# Patient Record
Sex: Female | Born: 1975 | Hispanic: Yes | Smoking: Former smoker
Health system: Southern US, Community
[De-identification: ages and names within clinical notes are randomized; demographics above are authoritative.]

## PROBLEM LIST (undated history)

## (undated) DIAGNOSIS — M79643 Pain in unspecified hand: Secondary | ICD-10-CM

## (undated) DIAGNOSIS — B009 Herpesviral infection, unspecified: Secondary | ICD-10-CM

## (undated) HISTORY — PX: GANGLION CYST EXCISION: SHX1691

## (undated) HISTORY — DX: Herpesviral infection, unspecified: B00.9

## (undated) HISTORY — DX: Pain in unspecified hand: M79.643

---

## 2005-07-30 ENCOUNTER — Emergency Department (HOSPITAL_COMMUNITY): Admission: EM | Admit: 2005-07-30 | Discharge: 2005-07-30 | Payer: Self-pay | Admitting: Emergency Medicine

## 2005-09-19 ENCOUNTER — Ambulatory Visit (HOSPITAL_COMMUNITY): Admission: RE | Admit: 2005-09-19 | Discharge: 2005-09-19 | Payer: Self-pay | Admitting: Obstetrics & Gynecology

## 2006-02-11 ENCOUNTER — Inpatient Hospital Stay (HOSPITAL_COMMUNITY): Admission: AD | Admit: 2006-02-11 | Discharge: 2006-02-13 | Payer: Self-pay | Admitting: Family Medicine

## 2006-02-11 ENCOUNTER — Ambulatory Visit: Payer: Self-pay | Admitting: Gynecology

## 2007-06-08 ENCOUNTER — Emergency Department (HOSPITAL_COMMUNITY): Admission: EM | Admit: 2007-06-08 | Discharge: 2007-06-08 | Payer: Self-pay | Admitting: Emergency Medicine

## 2008-12-19 ENCOUNTER — Encounter: Admission: RE | Admit: 2008-12-19 | Discharge: 2008-12-19 | Payer: Self-pay | Admitting: General Surgery

## 2011-04-24 ENCOUNTER — Encounter: Payer: Self-pay | Admitting: Family Medicine

## 2011-04-24 ENCOUNTER — Other Ambulatory Visit (HOSPITAL_COMMUNITY)
Admission: RE | Admit: 2011-04-24 | Discharge: 2011-04-24 | Disposition: A | Discharge status: Home / Self Care | Payer: BC Managed Care – PPO | Source: Ambulatory Visit | Attending: Family Medicine | Admitting: Family Medicine

## 2011-04-24 ENCOUNTER — Ambulatory Visit (INDEPENDENT_AMBULATORY_CARE_PROVIDER_SITE_OTHER): Payer: BC Managed Care – PPO | Admitting: Family Medicine

## 2011-04-24 VITALS — BP 120/80 | HR 52 | Temp 98.5°F | Wt 117.5 lb

## 2011-04-24 DIAGNOSIS — Z1159 Encounter for screening for other viral diseases: Secondary | ICD-10-CM | POA: Insufficient documentation

## 2011-04-24 DIAGNOSIS — Z01419 Encounter for gynecological examination (general) (routine) without abnormal findings: Secondary | ICD-10-CM | POA: Insufficient documentation

## 2011-04-24 DIAGNOSIS — Z136 Encounter for screening for cardiovascular disorders: Secondary | ICD-10-CM

## 2011-04-24 DIAGNOSIS — Z0000 Encounter for general adult medical examination without abnormal findings: Secondary | ICD-10-CM | POA: Insufficient documentation

## 2011-04-24 LAB — BASIC METABOLIC PANEL
Calcium: 9 mg/dL (ref 8.4–10.5)
GFR: 144.69 mL/min (ref >60.00)
Potassium: 3.3 mEq/L — ABNORMAL LOW (ref 3.5–5.1)
Sodium: 136 mEq/L (ref 135–145)

## 2011-04-24 LAB — LIPID PANEL
Cholesterol: 223 mg/dL — ABNORMAL HIGH (ref 0–200)
HDL: 67.6 mg/dL (ref >39.00)
Triglycerides: 233 mg/dL — ABNORMAL HIGH (ref 0.0–149.0)
VLDL: 46.6 mg/dL — ABNORMAL HIGH (ref 0.0–40.0)

## 2011-04-24 NOTE — Progress Notes (Signed)
Addended by: Gilmer Mor on: 04/24/2011 02:30 PM   Modules accepted: Orders

## 2011-04-24 NOTE — Progress Notes (Signed)
Subjective:    Patient ID: Sabrina Randall, female    DOB: January 30, 1976, 36 y.o.   MRN: [id number]  HPI G5P2 here to establish care and for CPX with no complaints. No h/o abnormal pap smears. Denies any dysuria or vaginal discharge. Sexually active with husband only.  Had a mammogram two years ago for cystic lesion, was benign.  No family h/o breast, cervical or uterine cancer that she is aware of.  Patient Active Problem List  Diagnoses  . Routine general medical examination at a health care facility   No past medical history on file. No past surgical history on file. History  Substance Use Topics  . Smoking status: Never Smoker   . Smokeless tobacco: Not on file  . Alcohol Use: Not on file   No family history on file. No Known Allergies No current outpatient prescriptions on file prior to visit.   The PMH, PSH, Social History, Family History, Medications, and allergies have been reviewed in [facility], and have been updated if relevant.    Review of Systems See HPI Patient reports no  vision/ hearing changes,anorexia, weight change, fever ,adenopathy, persistant / recurrent hoarseness, swallowing issues, chest pain, edema,persistant / recurrent cough, hemoptysis, dyspnea(rest, exertional, paroxysmal nocturnal), gastrointestinal  bleeding (melena, rectal bleeding), abdominal pain, excessive heart burn, GU symptoms(dysuria, hematuria, pyuria, voiding/incontinence  Issues) syncope, focal weakness, severe memory loss, concerning skin lesions, depression, anxiety, abnormal bruising/bleeding, major joint swelling, breast masses or abnormal vaginal bleeding.       Objective:   Physical Exam BP 120/80  Pulse 52  Temp(Src) 98.5 F (36.9 C) (Oral)  Wt 117 lb 8 oz (53.298 kg)  LMP 04/02/2011  General:  Well-developed,well-nourished,in no acute distress; alert,appropriate and cooperative throughout examination Head:  normocephalic and atraumatic.   Eyes:  vision grossly intact,  pupils equal, pupils round, and pupils reactive to light.   Ears:  R ear normal and L ear normal.   Nose:  no external deformity.   Mouth:  good dentition.   Neck:  No deformities, masses, or tenderness noted. Breasts:  No mass, nodules, thickening, tenderness, bulging, retraction, inflamation, nipple discharge or skin changes noted.   Lungs:  Normal respiratory effort, chest expands symmetrically. Lungs are clear to auscultation, no crackles or wheezes. Heart:  Normal rate and regular rhythm. S1 and S2 normal without gallop, murmur, click, rub or other extra sounds. Abdomen:  Bowel sounds positive,abdomen soft and non-tender without masses, organomegaly or hernias noted. Rectal:  no external abnormalities.   Genitalia:  Pelvic Exam:        External: normal female genitalia without lesions or masses        Vagina: normal without lesions or masses        Cervix: normal without lesions or masses        Adnexa: normal bimanual exam without masses or fullness        Uterus: normal by palpation        Pap smear: performed Msk:  No deformity or scoliosis noted of thoracic or lumbar spine.   Extremities:  No clubbing, cyanosis, edema, or deformity noted with normal full range of motion of all joints.   Neurologic:  alert & oriented X3 and gait normal.   Skin:  Intact without suspicious lesions or rashes Cervical Nodes:  No lymphadenopathy noted Axillary Nodes:  No palpable lymphadenopathy Psych:  Cognition and judgment appear intact. Alert and cooperative with normal attention span and concentration. No apparent delusions, illusions, hallucinations  Assessment & Plan:   1. Routine general medical examination at a health care facility  Basic Metabolic Panel (BMET) Lipid Profile  Reviewed preventive care protocols, scheduled due services, and updated immunizations Discussed nutrition, exercise, diet, and healthy lifestyle.

## 2011-04-24 NOTE — Patient Instructions (Signed)
It was very nice to meet you. We will be in touch with your lab and pap smear results.

## 2011-04-25 ENCOUNTER — Encounter: Payer: Self-pay | Admitting: *Deleted

## 2011-05-02 ENCOUNTER — Encounter: Payer: Self-pay | Admitting: *Deleted

## 2012-01-09 ENCOUNTER — Emergency Department (HOSPITAL_COMMUNITY)
Admission: EM | Admit: 2012-01-09 | Discharge: 2012-01-10 | Disposition: A | Discharge status: Home / Self Care | Payer: No Typology Code available for payment source | Attending: Emergency Medicine | Admitting: Emergency Medicine

## 2012-01-09 DIAGNOSIS — S301XXA Contusion of abdominal wall, initial encounter: Secondary | ICD-10-CM

## 2012-01-09 DIAGNOSIS — M549 Dorsalgia, unspecified: Secondary | ICD-10-CM | POA: Insufficient documentation

## 2012-01-09 DIAGNOSIS — M542 Cervicalgia: Secondary | ICD-10-CM | POA: Insufficient documentation

## 2012-01-09 DIAGNOSIS — S0003XA Contusion of scalp, initial encounter: Secondary | ICD-10-CM | POA: Insufficient documentation

## 2012-01-09 DIAGNOSIS — R51 Headache: Secondary | ICD-10-CM | POA: Insufficient documentation

## 2012-01-09 DIAGNOSIS — S1093XA Contusion of unspecified part of neck, initial encounter: Secondary | ICD-10-CM | POA: Insufficient documentation

## 2012-01-09 MED ORDER — OXYCODONE-ACETAMINOPHEN 5-325 MG PO TABS
2.0000 | ORAL_TABLET | Freq: Once | ORAL | Status: AC
  Administered 2012-01-10: 2 via ORAL
  Filled 2012-01-09: qty 2
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
## 2012-01-09 NOTE — ED Notes (Signed)
Pt. To CT
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
## 2012-01-10 ENCOUNTER — Emergency Department (HOSPITAL_COMMUNITY): Payer: No Typology Code available for payment source

## 2012-01-10 ENCOUNTER — Encounter (HOSPITAL_COMMUNITY): Payer: Self-pay | Admitting: Emergency Medicine

## 2012-01-10 MED ORDER — DIAZEPAM 5 MG PO TABS: 5.0000 mg | ORAL_TABLET | Freq: Three times a day (TID) | ORAL | Status: DC | PRN

## 2012-01-10 MED ORDER — OXYCODONE-ACETAMINOPHEN 5-325 MG PO TABS: 2.0000 | ORAL_TABLET | ORAL | Status: DC | PRN

## 2012-01-10 MED ORDER — OXYCODONE-ACETAMINOPHEN 5-325 MG PO TABS: 2.0000 | ORAL_TABLET | Freq: Once | ORAL | Status: DC

## 2012-01-10 MED ORDER — IBUPROFEN 800 MG PO TABS: 800.0000 mg | ORAL_TABLET | Freq: Three times a day (TID) | ORAL | Status: DC

## 2012-01-10 NOTE — ED Notes (Signed)
Pt currently in C collar. Pt appears to be in no apparent distress. Pt c/o throbbing pain in head, neck and back. Medication given. Will continue to monitor pt.

## 2012-01-10 NOTE — ED Notes (Signed)
Patient ambulated to restroom with assistance without difficulty; patient states that she feels she is ready to go home.  Dr. Norlene Campbell notified.

## 2012-01-10 NOTE — ED Notes (Signed)
Dr. Norlene Campbell removed pt from backboard using C spine precautions. Collar still placed on pt.

## 2012-01-10 NOTE — ED Notes (Signed)
Pt ambulatory leaving. Pt left with discharge instructions and prescriptions for medications. Pt verbalized understanding of instructions. Pt had no questions. Pt denies pain. Pt instructed not to drive due to medications given. Pt states neighbor in waiting room will drive her home. Pt left with family and friends.

## 2012-01-15 ENCOUNTER — Encounter (HOSPITAL_COMMUNITY): Payer: Self-pay | Admitting: Emergency Medicine

## 2013-03-24 ENCOUNTER — Ambulatory Visit (INDEPENDENT_AMBULATORY_CARE_PROVIDER_SITE_OTHER): Payer: BC Managed Care – PPO | Admitting: Psychology

## 2013-03-24 DIAGNOSIS — F4321 Adjustment disorder with depressed mood: Secondary | ICD-10-CM

## 2013-03-31 ENCOUNTER — Ambulatory Visit (INDEPENDENT_AMBULATORY_CARE_PROVIDER_SITE_OTHER): Payer: BC Managed Care – PPO | Admitting: Psychology

## 2013-03-31 DIAGNOSIS — F4323 Adjustment disorder with mixed anxiety and depressed mood: Secondary | ICD-10-CM

## 2013-04-07 ENCOUNTER — Ambulatory Visit (INDEPENDENT_AMBULATORY_CARE_PROVIDER_SITE_OTHER): Payer: BC Managed Care – PPO | Admitting: Psychology

## 2013-04-07 DIAGNOSIS — F4323 Adjustment disorder with mixed anxiety and depressed mood: Secondary | ICD-10-CM

## 2013-04-28 ENCOUNTER — Ambulatory Visit (INDEPENDENT_AMBULATORY_CARE_PROVIDER_SITE_OTHER): Payer: BC Managed Care – PPO | Admitting: Psychology

## 2013-04-28 DIAGNOSIS — F4321 Adjustment disorder with depressed mood: Secondary | ICD-10-CM

## 2013-05-04 ENCOUNTER — Other Ambulatory Visit: Payer: Self-pay | Admitting: Family Medicine

## 2013-05-04 DIAGNOSIS — Z0000 Encounter for general adult medical examination without abnormal findings: Secondary | ICD-10-CM

## 2013-05-04 DIAGNOSIS — Z136 Encounter for screening for cardiovascular disorders: Secondary | ICD-10-CM

## 2013-05-05 ENCOUNTER — Other Ambulatory Visit (INDEPENDENT_AMBULATORY_CARE_PROVIDER_SITE_OTHER): Payer: BC Managed Care – PPO

## 2013-05-05 ENCOUNTER — Ambulatory Visit (INDEPENDENT_AMBULATORY_CARE_PROVIDER_SITE_OTHER): Payer: BC Managed Care – PPO | Admitting: Psychology

## 2013-05-05 DIAGNOSIS — F4321 Adjustment disorder with depressed mood: Secondary | ICD-10-CM

## 2013-05-05 DIAGNOSIS — Z136 Encounter for screening for cardiovascular disorders: Secondary | ICD-10-CM

## 2013-05-05 DIAGNOSIS — Z0000 Encounter for general adult medical examination without abnormal findings: Secondary | ICD-10-CM

## 2013-05-05 LAB — CBC WITH DIFFERENTIAL/PLATELET
BASOS ABS: 0 10*3/uL (ref 0.0–0.1)
Basophils Relative: 0.5 % (ref 0.0–3.0)
EOS ABS: 0 10*3/uL (ref 0.0–0.7)
EOS PCT: 0.5 % (ref 0.0–5.0)
HCT: 33 % — ABNORMAL LOW (ref 36.0–46.0)
Hemoglobin: 10.6 g/dL — ABNORMAL LOW (ref 12.0–15.0)
LYMPHS PCT: 29.4 % (ref 12.0–46.0)
Lymphs Abs: 1.5 10*3/uL (ref 0.7–4.0)
MCHC: 32.1 g/dL (ref 30.0–36.0)
MCV: 71.6 fl — ABNORMAL LOW (ref 78.0–100.0)
Monocytes Absolute: 0.4 10*3/uL (ref 0.1–1.0)
Monocytes Relative: 7.9 % (ref 3.0–12.0)
NEUTROS PCT: 61.7 % (ref 43.0–77.0)
Neutro Abs: 3.2 10*3/uL (ref 1.4–7.7)
PLATELETS: 377 10*3/uL (ref 150.0–400.0)
RBC: 4.61 Mil/uL (ref 3.87–5.11)
RDW: 18.1 % — ABNORMAL HIGH (ref 11.5–14.6)
WBC: 5.2 10*3/uL (ref 4.5–10.5)

## 2013-05-05 LAB — COMPREHENSIVE METABOLIC PANEL
ALK PHOS: 69 U/L (ref 39–117)
ALT: 11 U/L (ref 0–35)
AST: 18 U/L (ref 0–37)
Albumin: 4 g/dL (ref 3.5–5.2)
BUN: 8 mg/dL (ref 6–23)
CHLORIDE: 106 meq/L (ref 96–112)
CO2: 25 meq/L (ref 19–32)
Calcium: 9 mg/dL (ref 8.4–10.5)
Creatinine, Ser: 0.6 mg/dL (ref 0.4–1.2)
GFR: 128.47 mL/min (ref >60.00)
Glucose, Bld: 76 mg/dL (ref 70–99)
Potassium: 3.6 mEq/L (ref 3.5–5.1)
SODIUM: 137 meq/L (ref 135–145)
TOTAL PROTEIN: 7.7 g/dL (ref 6.0–8.3)
Total Bilirubin: 0.9 mg/dL (ref 0.3–1.2)

## 2013-05-05 LAB — LIPID PANEL
CHOL/HDL RATIO: 4
Cholesterol: 200 mg/dL (ref 0–200)
HDL: 52 mg/dL (ref >39.00)
LDL CALC: 125 mg/dL — AB (ref 0–99)
Triglycerides: 115 mg/dL (ref 0.0–149.0)
VLDL: 23 mg/dL (ref 0.0–40.0)

## 2013-05-05 LAB — TSH: TSH: 0.74 u[IU]/mL (ref 0.35–5.50)

## 2013-05-09 ENCOUNTER — Encounter: Payer: Self-pay | Admitting: Family Medicine

## 2013-05-09 ENCOUNTER — Ambulatory Visit (INDEPENDENT_AMBULATORY_CARE_PROVIDER_SITE_OTHER): Payer: BC Managed Care – PPO | Admitting: Family Medicine

## 2013-05-09 VITALS — BP 124/70 | HR 85 | Temp 98.3°F | Ht 59.0 in | Wt 116.2 lb

## 2013-05-09 DIAGNOSIS — D509 Iron deficiency anemia, unspecified: Secondary | ICD-10-CM | POA: Insufficient documentation

## 2013-05-09 DIAGNOSIS — Z01419 Encounter for gynecological examination (general) (routine) without abnormal findings: Secondary | ICD-10-CM

## 2013-05-09 DIAGNOSIS — Z0000 Encounter for general adult medical examination without abnormal findings: Secondary | ICD-10-CM

## 2013-05-09 NOTE — Assessment & Plan Note (Signed)
Reviewed preventive care protocols, scheduled due services, and updated immunizations Discussed nutrition, exercise, diet, and healthy lifestyle.  

## 2013-05-09 NOTE — Assessment & Plan Note (Signed)
Periods have been heavier. Given list of iron rich foods and advised PNV.

## 2013-05-09 NOTE — Patient Instructions (Signed)
° °  Iron-Rich Diet ° °An iron-rich diet contains foods that are good sources of iron. Iron is an important mineral that helps your body produce hemoglobin. Hemoglobin is a protein in red blood cells that carries oxygen to the body's tissues. Sometimes, the iron level in your blood can be low. This may be caused by: °· A lack of iron in your diet. °· Blood loss. °· Times of growth, such as during pregnancy or during a child's growth and development. °Low levels of iron can cause a decrease in the number of red blood cells. This can result in iron deficiency anemia. Iron deficiency anemia symptoms include: °· Tiredness. °· Weakness. °· Irritability. °· Increased chance of infection. °Here are some recommendations for daily iron intake: °· Males older than 39 years of age need 8 mg of iron per day. °· Women ages 19 to 50 need 18 mg of iron per day. °· Pregnant women need 27 mg of iron per day, and women who are over 19 years of age and breastfeeding need 9 mg of iron per day. °· Women over the age of 50 need 8 mg of iron per day. °SOURCES OF IRON °There are 2 types of iron that are found in food: heme iron and nonheme iron. Heme iron is absorbed by the body better than nonheme iron. Heme iron is found in meat, poultry, and fish. Nonheme iron is found in grains, beans, and vegetables. °Heme Iron Sources °Food / Iron (mg) °· Chicken liver, 3 oz (85 g)/ 10 mg °· Beef liver, 3 oz (85 g)/ 5.5 mg °· Oysters, 3 oz (85 g)/ 8 mg °· Beef, 3 oz (85 g)/ 2 to 3 mg °· Shrimp, 3 oz (85 g)/ 2.8 mg °· Turkey, 3 oz (85 g)/ 2 mg °· Chicken, 3 oz (85 g) / 1 mg °· Fish (tuna, halibut), 3 oz (85 g)/ 1 mg °· Pork, 3 oz (85 g)/ 0.9 mg °Nonheme Iron Sources °Food / Iron (mg) °· Ready-to-eat breakfast cereal, iron-fortified / 3.9 to 7 mg °· Tofu, ½ cup / 3.4 mg °· Kidney beans, ½ cup / 2.6 mg °· Baked potato with skin / 2.7 mg °· Asparagus, ½ cup / 2.2 mg °· Avocado / 2 mg °· Dried peaches, ½ cup / 1.6 mg °· Raisins, ½ cup / 1.5 mg °· Soy milk,  1 cup / 1.5 mg °· Whole-wheat bread, 1 slice / 1.2 mg °· Spinach, 1 cup / 0.8 mg °· Broccoli, ½ cup / 0.6 mg °IRON ABSORPTION °Certain foods can decrease the body's absorption of iron. Try to avoid these foods and beverages while eating meals with iron-containing foods: °· Coffee. °· Tea. °· Fiber. °· Soy. °Foods containing vitamin C can help increase the amount of iron your body absorbs from iron sources, especially from nonheme sources. Eat foods with vitamin C along with iron-containing foods to increase your iron absorption. Foods that are high in vitamin C include many fruits and vegetables. Some good sources are: °· Fresh orange juice. °· Oranges. °· Strawberries. °· Mangoes. °· Grapefruit. °· Red bell peppers. °· Green bell peppers. °· Broccoli. °· Potatoes with skin. °· Tomato juice. °Document Released: 11/19/2004 Document Revised: 06/30/2011 Document Reviewed: 09/26/2010 °[employer]® Patient Information ©2014 [employer]. ° °

## 2013-05-09 NOTE — Progress Notes (Signed)
Pre-visit discussion using our clinic review tool. No additional management support is needed unless otherwise documented below in the visit note.  

## 2013-05-09 NOTE — Progress Notes (Signed)
Subjective:    Patient ID: Sabrina Randall, female    DOB: 09-24-1975, 38 y.o.   MRN: [id number]  HPI 38 yo G5P2 here for CPX with no complaints. No h/o abnormal pap smears.  Last pap smear done by me in 04/2011. Denies any dysuria or vaginal discharge. Sexually active with husband only.   No family h/o breast, cervical or uterine cancer that she is aware of.  Lab Results  Component Value Date   CHOL 200 05/05/2013   HDL 52.00 05/05/2013   LDLCALC 125* 05/05/2013   LDLDIRECT 133.5 04/24/2011   TRIG 115.0 05/05/2013   CHOLHDL 4 05/05/2013   Lab Results  Component Value Date   WBC 5.2 05/05/2013   HGB 10.6* 05/05/2013   HCT 33.0* 05/05/2013   MCV 71.6* 05/05/2013   PLT 377.0 05/05/2013   Lab Results  Component Value Date   NA 137 05/05/2013   K 3.6 05/05/2013   CL 106 05/05/2013   CO2 25 05/05/2013   Lab Results  Component Value Date   CREATININE 0.6 05/05/2013     Patient Active Problem List   Diagnosis Date Noted  . Encounter for routine gynecological examination 05/09/2013  . Routine general medical examination at a health care facility 04/24/2011   No past medical history on file. No past surgical history on file. History  Substance Use Topics  . Smoking status: Not on file  . Smokeless tobacco: Not on file  . Alcohol Use: Not on file   No family history on file. No Known Allergies Current Outpatient Prescriptions on File Prior to Visit  Medication Sig Dispense Refill  . ibuprofen (ADVIL,MOTRIN) 800 MG tablet Take 1 tablet (800 mg total) by mouth 3 (three) times daily.  21 tablet  0  . Multiple Vitamin (MULTIVITAMIN WITH MINERALS) TABS Take 1 tablet by mouth daily.       No current facility-administered medications on file prior to visit.   The PMH, PSH, Social History, Family History, Medications, and allergies have been reviewed in [facility], and have been updated if relevant.    Review of Systems See HPI Patient reports no  vision/ hearing changes,anorexia,  weight change, fever ,adenopathy, persistant / recurrent hoarseness, swallowing issues, chest pain, edema,persistant / recurrent cough, hemoptysis, dyspnea(rest, exertional, paroxysmal nocturnal), gastrointestinal  bleeding (melena, rectal bleeding), abdominal pain, excessive heart burn, GU symptoms(dysuria, hematuria, pyuria, voiding/incontinence  Issues) syncope, focal weakness, severe memory loss, concerning skin lesions, depression, anxiety, abnormal bruising/bleeding, major joint swelling, breast masses or abnormal vaginal bleeding.       Objective:   Physical Exam BP 124/70  Pulse 85  Temp(Src) 98.3 F (36.8 C) (Oral)  Ht 4\' 11"  (1.499 m)  Wt 116 lb 4 oz (52.731 kg)  BMI 23.47 kg/m2  SpO2 96%  LMP 05/06/2013  General:  Well-developed,well-nourished,in no acute distress; alert,appropriate and cooperative throughout examination Head:  normocephalic and atraumatic.   Eyes:  vision grossly intact, pupils equal, pupils round, and pupils reactive to light.   Ears:  R ear normal and L ear normal.   Nose:  no external deformity.   Mouth:  good dentition.   Neck:  No deformities, masses, or tenderness noted. Breasts:  No mass, nodules, thickening, tenderness, bulging, retraction, inflamation, nipple discharge or skin changes noted.   Lungs:  Normal respiratory effort, chest expands symmetrically. Lungs are clear to auscultation, no crackles or wheezes. Heart:  Normal rate and regular rhythm. S1 and S2 normal without gallop, murmur, click, rub or other extra sounds.  Abdomen:  Bowel sounds positive,abdomen soft and non-tender without masses, organomegaly or hernias noted. Msk:  No deformity or scoliosis noted of thoracic or lumbar spine.   Extremities:  No clubbing, cyanosis, edema, or deformity noted with normal full range of motion of all joints.   Neurologic:  alert & oriented X3 and gait normal.   Skin:  Intact without suspicious lesions or rashes Cervical Nodes:  No lymphadenopathy  noted Axillary Nodes:  No palpable lymphadenopathy Prominent fat pads under axilla bilaterally Psych:  Cognition and judgment appear intact. Alert and cooperative with normal attention span and concentration. No apparent delusions, illusions, hallucinations       Assessment & Plan:

## 2013-05-19 ENCOUNTER — Ambulatory Visit (INDEPENDENT_AMBULATORY_CARE_PROVIDER_SITE_OTHER): Payer: BC Managed Care – PPO | Admitting: Psychology

## 2013-05-19 DIAGNOSIS — F4321 Adjustment disorder with depressed mood: Secondary | ICD-10-CM

## 2013-05-26 ENCOUNTER — Ambulatory Visit (INDEPENDENT_AMBULATORY_CARE_PROVIDER_SITE_OTHER): Payer: BC Managed Care – PPO | Admitting: Psychology

## 2013-05-26 DIAGNOSIS — F4321 Adjustment disorder with depressed mood: Secondary | ICD-10-CM

## 2013-05-30 ENCOUNTER — Ambulatory Visit: Payer: BC Managed Care – PPO | Admitting: Family Medicine

## 2013-06-02 ENCOUNTER — Ambulatory Visit: Payer: BC Managed Care – PPO | Admitting: Psychology

## 2013-06-06 ENCOUNTER — Ambulatory Visit: Payer: BC Managed Care – PPO | Admitting: Family Medicine

## 2013-06-09 ENCOUNTER — Ambulatory Visit: Payer: BC Managed Care – PPO | Admitting: Psychology

## 2013-06-16 ENCOUNTER — Ambulatory Visit: Payer: BC Managed Care – PPO | Admitting: Psychology

## 2013-06-20 ENCOUNTER — Ambulatory Visit: Payer: BC Managed Care – PPO | Admitting: Family Medicine

## 2013-06-23 ENCOUNTER — Ambulatory Visit: Payer: BC Managed Care – PPO | Admitting: Psychology

## 2013-06-30 ENCOUNTER — Ambulatory Visit (INDEPENDENT_AMBULATORY_CARE_PROVIDER_SITE_OTHER): Payer: BC Managed Care – PPO | Admitting: Psychology

## 2013-06-30 DIAGNOSIS — F4321 Adjustment disorder with depressed mood: Secondary | ICD-10-CM

## 2013-07-07 ENCOUNTER — Ambulatory Visit (INDEPENDENT_AMBULATORY_CARE_PROVIDER_SITE_OTHER): Payer: BC Managed Care – PPO | Admitting: Psychology

## 2013-07-07 DIAGNOSIS — F4321 Adjustment disorder with depressed mood: Secondary | ICD-10-CM

## 2013-07-14 ENCOUNTER — Ambulatory Visit: Payer: BC Managed Care – PPO | Admitting: Psychology

## 2013-07-21 ENCOUNTER — Ambulatory Visit (INDEPENDENT_AMBULATORY_CARE_PROVIDER_SITE_OTHER): Payer: BC Managed Care – PPO | Admitting: Psychology

## 2013-07-21 DIAGNOSIS — F4321 Adjustment disorder with depressed mood: Secondary | ICD-10-CM

## 2013-07-29 ENCOUNTER — Other Ambulatory Visit (HOSPITAL_COMMUNITY)
Admission: RE | Admit: 2013-07-29 | Discharge: 2013-07-29 | Disposition: A | Discharge status: Home / Self Care | Payer: BC Managed Care – PPO | Source: Ambulatory Visit | Attending: Family Medicine | Admitting: Family Medicine

## 2013-07-29 ENCOUNTER — Ambulatory Visit (INDEPENDENT_AMBULATORY_CARE_PROVIDER_SITE_OTHER): Payer: BC Managed Care – PPO | Admitting: Family Medicine

## 2013-07-29 ENCOUNTER — Encounter: Payer: Self-pay | Admitting: Family Medicine

## 2013-07-29 VITALS — BP 120/66 | HR 65 | Temp 98.3°F | Wt 118.2 lb

## 2013-07-29 DIAGNOSIS — Z124 Encounter for screening for malignant neoplasm of cervix: Secondary | ICD-10-CM | POA: Insufficient documentation

## 2013-07-29 DIAGNOSIS — Z113 Encounter for screening for infections with a predominantly sexual mode of transmission: Secondary | ICD-10-CM | POA: Insufficient documentation

## 2013-07-29 DIAGNOSIS — R87810 Cervical high risk human papillomavirus (HPV) DNA test positive: Secondary | ICD-10-CM | POA: Insufficient documentation

## 2013-07-29 DIAGNOSIS — R229 Localized swelling, mass and lump, unspecified: Secondary | ICD-10-CM

## 2013-07-29 DIAGNOSIS — N760 Acute vaginitis: Secondary | ICD-10-CM | POA: Insufficient documentation

## 2013-07-29 DIAGNOSIS — Z1151 Encounter for screening for human papillomavirus (HPV): Secondary | ICD-10-CM | POA: Insufficient documentation

## 2013-07-29 DIAGNOSIS — Z01419 Encounter for gynecological examination (general) (routine) without abnormal findings: Secondary | ICD-10-CM

## 2013-07-29 DIAGNOSIS — R2233 Localized swelling, mass and lump, upper limb, bilateral: Secondary | ICD-10-CM

## 2013-07-29 NOTE — Progress Notes (Signed)
Subjective:    Patient ID: Sabrina Randall, female    DOB: 1976-04-17, 37 y.o.   MRN: [id number]  HPI 38 yo G5P2 here gyn exam. No h/o abnormal pap smears.  Last pap smear done by me in 04/2011. Denies any dysuria or vaginal discharge. Sexually active with husband only.   No family h/o breast, cervical or uterine cancer that she is aware of.  Area under her right axilla is growing and more painful.  Left enlarged but about the same and not painful.  ? Fat pads or lipoma previously.     Lab Results  Component Value Date   WBC 5.2 05/05/2013   HGB 10.6* 05/05/2013   HCT 33.0* 05/05/2013   MCV 71.6* 05/05/2013   PLT 377.0 05/05/2013   Lab Results  Component Value Date   NA 137 05/05/2013   K 3.6 05/05/2013   CL 106 05/05/2013   CO2 25 05/05/2013   Lab Results  Component Value Date   CREATININE 0.6 05/05/2013     Patient Active Problem List   Diagnosis Date Noted  . Encounter for routine gynecological examination 05/09/2013  . Anemia, iron deficiency 05/09/2013  . Routine general medical examination at a health care facility 04/24/2011   No past medical history on file. No past surgical history on file. History  Substance Use Topics  . Smoking status: Former Games developer  . Smokeless tobacco: Not on file  . Alcohol Use: Yes   No family history on file. No Known Allergies Current Outpatient Prescriptions on File Prior to Visit  Medication Sig Dispense Refill  . ibuprofen (ADVIL,MOTRIN) 800 MG tablet Take 1 tablet (800 mg total) by mouth 3 (three) times daily.  21 tablet  0  . Multiple Vitamin (MULTIVITAMIN WITH MINERALS) TABS Take 1 tablet by mouth daily.       No current facility-administered medications on file prior to visit.   The PMH, PSH, Social History, Family History, Medications, and allergies have been reviewed in [facility], and have been updated if relevant.    Review of Systems See HPI Patient reports no  vision/ hearing changes,anorexia, weight change, fever  ,adenopathy, persistant / recurrent hoarseness, swallowing issues, chest pain, edema,persistant / recurrent cough, hemoptysis, dyspnea(rest, exertional, paroxysmal nocturnal), gastrointestinal  bleeding (melena, rectal bleeding), abdominal pain, excessive heart burn, GU symptoms(dysuria, hematuria, pyuria, voiding/incontinence  Issues) syncope, focal weakness, severe memory loss, concerning skin lesions, depression, anxiety, abnormal bruising/bleeding, major joint swelling, breast masses or abnormal vaginal bleeding.       Objective:   Physical Exam BP 120/66  Pulse 65  Temp(Src) 98.3 F (36.8 C) (Oral)  Wt 118 lb 4 oz (53.638 kg)  SpO2 99%  LMP 06/24/2013   General:  Well-developed,well-nourished,in no acute distress; alert,appropriate and cooperative throughout examination Head:  normocephalic and atraumatic.   Breasts:  No mass, nodules, thickening, tenderness, bulging, retraction, inflamation, nipple discharge or skin changes noted.   Lungs:  Normal respiratory effort, chest expands symmetrically. Lungs are clear to auscultation, no crackles or wheezes. Heart:  Normal rate and regular rhythm. S1 and S2 normal without gallop, murmur, click, rub or other extra sounds. Abdomen:  Bowel sounds positive,abdomen soft and non-tender without masses, organomegaly or hernias noted. Rectal:  no external abnormalities.   Genitalia:  Pelvic Exam:        External: normal female genitalia without lesions or masses        Vagina: normal without lesions or masses        Cervix: normal without lesions  or masses        Adnexa: normal bimanual exam without masses or fullness        Uterus: normal by palpation        Pap smear: performed Msk:  No deformity or scoliosis noted of thoracic or lumbar spine.   Extremities:  No clubbing, cyanosis, edema, or deformity noted with normal full range of motion of all joints.   Prominent fat pads under axilla bilaterally  Neurologic:  alert & oriented X3 and gait  normal.   Skin:  Intact without suspicious lesions or rashes Psych:  Cognition and judgment appear intact. Alert and cooperative with normal attention span and concentration. No apparent delusions, illusions, hallucinations      Assessment & Plan:

## 2013-07-29 NOTE — Progress Notes (Signed)
Pre visit review using our clinic review tool, if applicable. No additional management support is needed unless otherwise documented below in the visit note. 

## 2013-07-29 NOTE — Assessment & Plan Note (Signed)
Pap smear done today. STD screening added to pap per patient request.

## 2013-07-29 NOTE — Assessment & Plan Note (Signed)
Soft tissue U/s. Will await results and likely refer to surgery. The patient indicates understanding of these issues and agrees with the plan.

## 2013-07-29 NOTE — Patient Instructions (Signed)
Good to see you.  Have a good weekend and say hello to your family for me.  Please stop by to see Shirlee LimerickMarion on your way out to set up your ultrasound.

## 2013-08-02 LAB — CERVICOVAGINAL ANCILLARY ONLY
BACTERIAL VAGINITIS: POSITIVE — AB
CANDIDA VAGINITIS: POSITIVE — AB

## 2013-08-04 ENCOUNTER — Other Ambulatory Visit: Payer: Self-pay | Admitting: Family Medicine

## 2013-08-04 DIAGNOSIS — R87810 Cervical high risk human papillomavirus (HPV) DNA test positive: Secondary | ICD-10-CM

## 2013-08-08 ENCOUNTER — Other Ambulatory Visit: Payer: Self-pay | Admitting: Family Medicine

## 2013-08-08 DIAGNOSIS — R2233 Localized swelling, mass and lump, upper limb, bilateral: Secondary | ICD-10-CM

## 2013-08-08 LAB — CERVICOVAGINAL ANCILLARY ONLY: Herpes: NEGATIVE

## 2013-08-11 ENCOUNTER — Ambulatory Visit (INDEPENDENT_AMBULATORY_CARE_PROVIDER_SITE_OTHER): Payer: BC Managed Care – PPO | Admitting: Psychology

## 2013-08-11 DIAGNOSIS — F4321 Adjustment disorder with depressed mood: Secondary | ICD-10-CM

## 2013-08-24 ENCOUNTER — Ambulatory Visit (INDEPENDENT_AMBULATORY_CARE_PROVIDER_SITE_OTHER): Payer: BC Managed Care – PPO | Admitting: Obstetrics & Gynecology

## 2013-08-24 ENCOUNTER — Encounter: Payer: Self-pay | Admitting: Obstetrics & Gynecology

## 2013-08-24 VITALS — BP 90/68 | HR 89 | Ht 59.0 in | Wt 113.6 lb

## 2013-08-24 DIAGNOSIS — B3731 Acute candidiasis of vulva and vagina: Secondary | ICD-10-CM

## 2013-08-24 DIAGNOSIS — B373 Candidiasis of vulva and vagina: Secondary | ICD-10-CM

## 2013-08-24 DIAGNOSIS — N760 Acute vaginitis: Secondary | ICD-10-CM

## 2013-08-24 DIAGNOSIS — R87810 Cervical high risk human papillomavirus (HPV) DNA test positive: Secondary | ICD-10-CM

## 2013-08-24 DIAGNOSIS — B9689 Other specified bacterial agents as the cause of diseases classified elsewhere: Secondary | ICD-10-CM

## 2013-08-24 DIAGNOSIS — A499 Bacterial infection, unspecified: Secondary | ICD-10-CM

## 2013-08-24 MED ORDER — FLUCONAZOLE 150 MG PO TABS: 150.0000 mg | ORAL_TABLET | ORAL | Status: DC

## 2013-08-24 MED ORDER — METRONIDAZOLE 500 MG PO TABS: 500.0000 mg | ORAL_TABLET | Freq: Two times a day (BID) | ORAL | Status: AC

## 2013-08-24 NOTE — Progress Notes (Signed)
   CLINIC ENCOUNTER NOTE  History:  38 y.o. F here today for follow up for abnormal pap smear.  On 07/29/13, she had a normal pap smear but positive HRHPV. No history of cervical dysplasia.  Patient also was evaluated for vaginitis, had positive BV and yeast and did not get treated at that time. She is requesting treatment today as she is having vaginal itching. No other symptoms.   The following portions of the patient's history were reviewed and updated as appropriate: allergies, current medications, past family history, past medical history, past social history, past surgical history and problem list.  Review of Systems:  Pertinent items are noted in HPI.  Objective:  BP 90/68  Pulse 89  Ht 4\' 11"  (1.499 m)  Wt 113 lb 9.6 oz (51.529 kg)  BMI 22.93 kg/m2  LMP 08/17/2013 Physical Exam deferred  Labs and Imaging Results for orders placed in visit on 07/29/13 (from the past 672 hour(s))  CERVICOVAGINAL ANCILLARY ONLY   Collection Time    07/29/13 12:00 AM      Result Value Ref Range   Bacterial vaginitis **POSITIVE for Gardnerella vaginalis** (*)    Candida vaginitis **POSITIVE for Candida albicans** (*)   CERVICOVAGINAL ANCILLARY ONLY   Collection Time    07/29/13 12:00 AM      Result Value Ref Range   Herpes NEGATIVE for HSV 1 and 2     07/29/13 Pap Smear Diagnosis NEGATIVE FOR INTRAEPITHELIAL LESIONS OR MALIGNANCY. FUNGAL ORGANISMS PRESENT CONSISTENT WITH CANDIDA. 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  Repeat cotesting in one year as per ASCCP guidelines. Diflucan and Metronidazole prescribed.  Patient reported washing her vulva with a topical mint solution she obtained from her mother-in-law.  Proper vulvar hygiene emphasized: discussed avoidance of perfumed soaps, detergents, lotions and any type of douches; in  addition to wearing cotton underwear and no underwear at night.  Emphasized need to establish good vaginal pH balance to avoid vaginitis. Will follow up response.   Return to clinic for repeat cotesting in one year or for any gynecologic concerns as needed.    Jaynie CollinsUGONNA  Creed Kail, MD, FACOG Attending Obstetrician & Gynecologist Faculty Practice, [facility]

## 2013-08-24 NOTE — Patient Instructions (Addendum)
Can use 1% hydrocortisone cream externally Vaginitis Vaginitis is an inflammation of the vagina. It is most often caused by a change in the normal balance of the bacteria and yeast that live in the vagina. This change in balance causes an overgrowth of certain bacteria or yeast, which causes the inflammation. There are different types of vaginitis, but the most common types are:  Bacterial vaginosis.  Yeast infection (candidiasis).  Trichomoniasis vaginitis. This is a sexually transmitted infection (STI).  Viral vaginitis.  Atropic vaginitis.  Allergic vaginitis. CAUSES  The cause depends on the type of vaginitis. Vaginitis can be caused by:  Bacteria (bacterial vaginosis).  Yeast (yeast infection).  A parasite (trichomoniasis vaginitis)  A virus (viral vaginitis).  Low hormone levels (atrophic vaginitis). Low hormone levels can occur during pregnancy, breastfeeding, or after menopause.  Irritants, such as bubble baths, scented tampons, and feminine sprays (allergic vaginitis). Other factors can change the normal balance of the yeast and bacteria that live in the vagina. These include:  Antibiotic medicines.  Poor hygiene.  Diaphragms, vaginal sponges, spermicides, birth control pills, and intrauterine devices (IUD).  Sexual intercourse.  Infection.  Uncontrolled diabetes.  A weakened immune system. SYMPTOMS  Symptoms can vary depending on the cause of the vaginitis. Common symptoms include:  Abnormal vaginal discharge.  The discharge is white, gray, or yellow with bacterial vaginosis.  The discharge is thick, white, and cheesy with a yeast infection.  The discharge is frothy and yellow or greenish with trichomoniasis.  A bad vaginal odor.  The odor is fishy with bacterial vaginosis.  Vaginal itching, pain, or swelling.  Painful intercourse.  Pain or burning when urinating. Sometimes, there are no symptoms. TREATMENT  Treatment will vary depending on  the type of infection.   Bacterial vaginosis and trichomoniasis are often treated with antibiotic creams or pills.  Yeast infections are often treated with antifungal medicines, such as vaginal creams or suppositories.  Viral vaginitis has no cure, but symptoms can be treated with medicines that relieve discomfort. Your sexual partner should be treated as well.  Atrophic vaginitis may be treated with an estrogen cream, pill, suppository, or vaginal ring. If vaginal dryness occurs, lubricants and moisturizing creams may help. You may be told to avoid scented soaps, sprays, or douches.  Allergic vaginitis treatment involves quitting the use of the product that is causing the problem. Vaginal creams can be used to treat the symptoms. HOME CARE INSTRUCTIONS   Take all medicines as directed by your caregiver.  Keep your genital area clean and dry. Avoid soap and only rinse the area with water.  Avoid douching. It can remove the healthy bacteria in the vagina.  Do not use tampons or have sexual intercourse until your vaginitis has been treated. Use sanitary pads while you have vaginitis.  Wipe from front to back. This avoids the spread of bacteria from the rectum to the vagina.  Let air reach your genital area.  Wear cotton underwear to decrease moisture buildup.  Avoid wearing underwear while you sleep until your vaginitis is gone.  Avoid tight pants and underwear or nylons without a cotton panel.  Take off wet clothing (especially bathing suits) as soon as possible.  Use mild, non-scented products. Avoid using irritants, such as:  Scented feminine sprays.  Fabric softeners.  Scented detergents.  Scented tampons.  Scented soaps or bubble baths.  Practice safe sex and use condoms. Condoms may prevent the spread of trichomoniasis and viral vaginitis. SEEK MEDICAL CARE IF:  You have abdominal pain.  You have a fever or persistent symptoms for more than 2 3 days.  You have  a fever and your symptoms suddenly get worse. Document Released: 02/02/2007 Document Revised: 12/31/2011 Document Reviewed: 09/18/2011 [facility] Patient Information 2014 [place].  Human Papillomavirus Human papillomavirus (HPV) is the most common sexually transmitted infection (STI) and is highly contagious. HPV infections cause genital warts and cancers to the outlet of the womb (cervix), birth canal (vagina), opening of the birth canal (vulva), and anus. There are over 100 types of HPV. Four types of HPV are responsible for causing 70% of all cervical cancers. Ninety percent of anal cancers and genital warts are caused by HPV. Unless you have wart-like lesions in the throat or genital warts that you can see or feel, HPV usually does not cause symptoms. Therefore, people can be infected for long periods and pass it on to others without knowing it. HPV in pregnancy usually does not cause a problem for the mother or baby. If the mother has genital warts, the baby rarely gets infected. When the HPV infection is found to be pre-cancerous on the cervix, vagina, or vulva, the mother will be followed closely during the pregnancy. Any needed treatment will be done after the baby is born. CAUSES   Having unprotected sex. HPV can be spread by oral, vaginal, or anal sexual activity.  Having several sex partners.  Having a sex partner who has other sex partners.  Having or having had another sexually transmitted infection. SYMPTOMS   More than 90% of people carrying HPV cannot tell anything is wrong.  Wart-like lesions in the throat (from having oral sex).  Warts in the infected skin or mucous membranes.  Genital warts may itch, burn, or bleed.  Genital warts may be painful or bleed during sexual intercourse. DIAGNOSIS   Genital warts are easily seen with the naked eye.  Currently, there is no FDA-approved test to detect HPV in males.  In females, a Pap test can show cells which are  infected with HPV.  In females, a scope can be used to view the cervix (colposcopy). A colposcopy can be performed if the pelvic exam or Pap test is abnormal.  In females, a sample of tissue may be removed (biopsy) during the colposcopy. TREATMENT   Treatment of genital warts can include:  Podophyllin lotion or gel.  Bichloroacetic acid (BCA) or trichloroacetic acid (TCA).  Podofilox solution or gel.  Imiquimod cream.  Interferon injections.  Use of a probe to apply extreme cold (cryotherapy).  Application of an intense beam of light (laser treatment).  Use of a probe to apply extreme heat (electrocautery).  Surgery.  HPV of the cervix, vagina, or vulva can be treated with:  Cryotherapy.  Laser treatment.  Electrocautery.  Surgery. Your caregiver will follow you closely after you are treated. This is because the HPV can come back and may need treatment again. HOME CARE INSTRUCTIONS   Follow your caregiver's instructions regarding medications, Pap tests, and follow-up exams.  Do not touch or scratch the warts.  Do not treat genital warts with medication used for treating hand warts.  Tell your sex partner about your infection because he or she may also need treatment.  Do not have sex while you are being treated.  After treatment, use condoms during sex to prevent future infections.  Have only 1 sex partner.  Have a sex partner who does not have other sex partners.  Use over-the-counter creams for  itching or irritation as directed by your caregiver.  Use over-the-counter or prescription medicines for pain, discomfort, or fever as directed by your caregiver.  Do not douche or use tampons during treatment of HPV. PREVENTION   Talk to your caregiver about getting the HPV vaccines. These vaccines prevent some HPV infections and cancers. It is recommended that the vaccine be given to males and females between the age of 62 and 80 years old. It will not work if  you already have HPV and it is not recommended for pregnant women. The vaccines are not recommended for pregnant women.  Call your caregiver if you think you are pregnant and have the HPV.  A PAP test is done to screen for cervical cancer.  The first PAP test should be done at age 41.  Between ages 60 and 60, PAP tests are repeated every 2 years.  Beginning at age 90, you are advised to have a PAP test every 3 years as long as your past 3 PAP tests have been normal.  Some women have medical problems that increase the chance of getting cervical cancer. Talk to your caregiver about these problems. It is especially important to talk to your caregiver if a new problem develops soon after your last PAP test. In these cases, your caregiver may recommend more frequent screening and Pap tests.  The above recommendations are the same for women who have or have not gotten the vaccine for HPV (Human Papillomavirus).  If you had a hysterectomy for a problem that was not a cancer or a condition that could lead to cancer, then you no longer need Pap tests. However, even if you no longer need a PAP test, a regular exam is a good idea to make sure no other problems are starting.   If you are between ages 70 and 80, and you have had normal Pap tests going back 10 years, you no longer need Pap tests. However, even if you no longer need a PAP test, a regular exam is a good idea to make sure no other problems are starting.  If you have had past treatment for cervical cancer or a condition that could lead to cancer, you need Pap tests and screening for cancer for at least 20 years after your treatment.  If Pap tests have been discontinued, risk factors (such as a new sexual partner)need to be re-assessed to determine if screening should be resumed.  Some women may need screenings more often if they are at high risk for cervical cancer. SEEK MEDICAL CARE IF:   The treated skin becomes red, swollen or  painful.  You have an oral temperature above 102 F (38.9 C).  You feel generally ill.  You feel lumps or pimple-like projections in and around your genital area.  You develop bleeding of the vagina or the treatment area.  You develop painful sexual intercourse. Document Released: 06/28/2003 Document Revised: 06/30/2011 Document Reviewed: 06/17/2007 [facility] Patient Information 2014 [place], [place].  HPV Test The HPV (human papillomavirus) test is used to screen for high-risk types with HPV infection. HPV is a group of about 100 related viruses, of which 40 types are genital viruses. Most HPV viruses cause infections that usually resolve without treatment within 2 years. Some HPV infections can cause skin and genital warts (condylomata). HPV types 16, 18, 31 and 45 are considered high-risk types of HPV. High-risk types of HPV do not usually cause visible warts, but if untreated, may lead to cancers of the  outlet of the womb (cervix) or anus. An HPV test identifies the DNA (genetic) strands of the HPV infection. Because the test identifies the DNA strands, the test is also referred to as the HPV DNA test. Although HPV is found in both males and females, the HPV test is only used to screen for cervical cancer in females. This test is recommended for females:  With an abnormal Pap test.  After treatment of an abnormal Pap test.  Aged 39 and older.  After treatment of a high-risk HPV infection. The HPV test may be done at the same time as a Pap test in females over the age of 47. Both the HPV and Pap test require a sample of cells from the cervix. PREPARATION FOR TEST  You may be asked to avoid douching, tampons, or vaginal medicines for 48 hours before the HPV test. You will be asked to urinate before the test. For the HPV test, you will need to lie on an exam table with your feet in stirrups. A spatula will be inserted into the vagina. The spatula will be used to swab the cervix for a cell  and mucus sample. The sample will be further evaluated in a lab under a microscope. NORMAL FINDINGS  Normal: High-risk HPV is not found.  Ranges for normal findings may vary among different laboratories and hospitals. You should always check with your doctor after having lab work or other tests done to discuss the meaning of your test results and whether your values are considered within normal limits. MEANING OF TEST An abnormal HPV test means that high-risk HPV is found. Your caregiver may recommend further testing. Your caregiver will go over the test results with you. He or she will and discuss the importance and meaning of your results, as well as treatment options and the need for additional tests, if necessary. OBTAINING THE RESULTS  It is your responsibility to obtain your test results. Ask the lab or department performing the test when and how you will get your results. Document Released: 05/02/2004 Document Revised: 06/30/2011 Document Reviewed: 01/15/2005 [facility] Patient Information 2014 [place], [place].

## 2013-08-31 ENCOUNTER — Ambulatory Visit
Admission: RE | Admit: 2013-08-31 | Discharge: 2013-08-31 | Disposition: A | Discharge status: Home / Self Care | Payer: BC Managed Care – PPO | Source: Ambulatory Visit | Attending: Family Medicine | Admitting: Family Medicine

## 2013-08-31 ENCOUNTER — Encounter (INDEPENDENT_AMBULATORY_CARE_PROVIDER_SITE_OTHER): Payer: Self-pay

## 2013-08-31 DIAGNOSIS — R2233 Localized swelling, mass and lump, upper limb, bilateral: Secondary | ICD-10-CM

## 2013-09-01 ENCOUNTER — Ambulatory Visit (INDEPENDENT_AMBULATORY_CARE_PROVIDER_SITE_OTHER): Payer: BC Managed Care – PPO | Admitting: Psychology

## 2013-09-01 DIAGNOSIS — F4321 Adjustment disorder with depressed mood: Secondary | ICD-10-CM

## 2013-09-08 ENCOUNTER — Ambulatory Visit (INDEPENDENT_AMBULATORY_CARE_PROVIDER_SITE_OTHER): Payer: BC Managed Care – PPO | Admitting: Psychology

## 2013-09-08 DIAGNOSIS — F4321 Adjustment disorder with depressed mood: Secondary | ICD-10-CM

## 2013-09-22 ENCOUNTER — Ambulatory Visit: Payer: BC Managed Care – PPO | Admitting: Psychology

## 2013-10-06 ENCOUNTER — Ambulatory Visit: Payer: BC Managed Care – PPO | Admitting: Psychology

## 2013-10-20 ENCOUNTER — Ambulatory Visit (INDEPENDENT_AMBULATORY_CARE_PROVIDER_SITE_OTHER): Payer: BC Managed Care – PPO | Admitting: Psychology

## 2013-10-20 DIAGNOSIS — F4321 Adjustment disorder with depressed mood: Secondary | ICD-10-CM

## 2013-11-03 ENCOUNTER — Ambulatory Visit (INDEPENDENT_AMBULATORY_CARE_PROVIDER_SITE_OTHER): Payer: BC Managed Care – PPO | Admitting: Psychology

## 2013-11-03 DIAGNOSIS — F4321 Adjustment disorder with depressed mood: Secondary | ICD-10-CM

## 2013-11-17 ENCOUNTER — Ambulatory Visit: Payer: BC Managed Care – PPO | Admitting: Psychology

## 2015-01-02 ENCOUNTER — Other Ambulatory Visit: Payer: Self-pay | Admitting: Family Medicine

## 2015-01-02 DIAGNOSIS — Z0000 Encounter for general adult medical examination without abnormal findings: Secondary | ICD-10-CM

## 2015-01-02 DIAGNOSIS — Z114 Encounter for screening for human immunodeficiency virus [HIV]: Secondary | ICD-10-CM

## 2015-01-03 ENCOUNTER — Other Ambulatory Visit (INDEPENDENT_AMBULATORY_CARE_PROVIDER_SITE_OTHER): Payer: BLUE CROSS/BLUE SHIELD

## 2015-01-03 DIAGNOSIS — Z0000 Encounter for general adult medical examination without abnormal findings: Secondary | ICD-10-CM | POA: Diagnosis not present

## 2015-01-03 DIAGNOSIS — Z114 Encounter for screening for human immunodeficiency virus [HIV]: Secondary | ICD-10-CM

## 2015-01-03 LAB — COMPREHENSIVE METABOLIC PANEL
ALBUMIN: 4.3 g/dL (ref 3.5–5.2)
ALT: 18 U/L (ref 0–35)
AST: 21 U/L (ref 0–37)
Alkaline Phosphatase: 92 U/L (ref 39–117)
BUN: 13 mg/dL (ref 6–23)
CALCIUM: 9.6 mg/dL (ref 8.4–10.5)
CHLORIDE: 103 meq/L (ref 96–112)
CO2: 26 meq/L (ref 19–32)
Creatinine, Ser: 0.57 mg/dL (ref 0.40–1.20)
GFR: 124.79 mL/min (ref >60.00)
Glucose, Bld: 81 mg/dL (ref 70–99)
POTASSIUM: 3.7 meq/L (ref 3.5–5.1)
SODIUM: 139 meq/L (ref 135–145)
Total Bilirubin: 0.5 mg/dL (ref 0.2–1.2)
Total Protein: 7.7 g/dL (ref 6.0–8.3)

## 2015-01-03 LAB — CBC WITH DIFFERENTIAL/PLATELET
BASOS PCT: 0.4 % (ref 0.0–3.0)
Basophils Absolute: 0 10*3/uL (ref 0.0–0.1)
EOS PCT: 0.8 % (ref 0.0–5.0)
Eosinophils Absolute: 0 10*3/uL (ref 0.0–0.7)
HCT: 37.9 % (ref 36.0–46.0)
HEMOGLOBIN: 12 g/dL (ref 12.0–15.0)
Lymphocytes Relative: 36 % (ref 12.0–46.0)
Lymphs Abs: 1.8 10*3/uL (ref 0.7–4.0)
MCHC: 31.8 g/dL (ref 30.0–36.0)
MCV: 77.2 fl — ABNORMAL LOW (ref 78.0–100.0)
MONO ABS: 0.3 10*3/uL (ref 0.1–1.0)
Monocytes Relative: 6.8 % (ref 3.0–12.0)
NEUTROS ABS: 2.7 10*3/uL (ref 1.4–7.7)
Neutrophils Relative %: 56 % (ref 43.0–77.0)
PLATELETS: 323 10*3/uL (ref 150.0–400.0)
RBC: 4.91 Mil/uL (ref 3.87–5.11)
RDW: 18.1 % — AB (ref 11.5–15.5)
WBC: 4.9 10*3/uL (ref 4.0–10.5)

## 2015-01-03 LAB — LIPID PANEL
CHOLESTEROL: 203 mg/dL — AB (ref 0–200)
HDL: 55.1 mg/dL (ref >39.00)
LDL CALC: 127 mg/dL — AB (ref 0–99)
NonHDL: 147.95
TRIGLYCERIDES: 103 mg/dL (ref 0.0–149.0)
Total CHOL/HDL Ratio: 4
VLDL: 20.6 mg/dL (ref 0.0–40.0)

## 2015-01-03 LAB — TSH: TSH: 0.78 u[IU]/mL (ref 0.35–4.50)

## 2015-01-04 LAB — HIV ANTIBODY (ROUTINE TESTING W REFLEX): HIV 1&2 Ab, 4th Generation: NONREACTIVE

## 2015-01-10 ENCOUNTER — Other Ambulatory Visit (HOSPITAL_COMMUNITY)
Admission: RE | Admit: 2015-01-10 | Discharge: 2015-01-10 | Disposition: A | Discharge status: Home / Self Care | Payer: BLUE CROSS/BLUE SHIELD | Source: Ambulatory Visit | Attending: Family Medicine | Admitting: Family Medicine

## 2015-01-10 ENCOUNTER — Other Ambulatory Visit: Payer: Self-pay | Admitting: Family Medicine

## 2015-01-10 ENCOUNTER — Encounter: Payer: Self-pay | Admitting: Family Medicine

## 2015-01-10 ENCOUNTER — Ambulatory Visit (INDEPENDENT_AMBULATORY_CARE_PROVIDER_SITE_OTHER): Payer: BLUE CROSS/BLUE SHIELD | Admitting: Family Medicine

## 2015-01-10 VITALS — BP 100/68 | HR 76 | Temp 98.2°F | Ht 59.25 in | Wt 121.0 lb

## 2015-01-10 DIAGNOSIS — Z23 Encounter for immunization: Secondary | ICD-10-CM

## 2015-01-10 DIAGNOSIS — N760 Acute vaginitis: Secondary | ICD-10-CM | POA: Insufficient documentation

## 2015-01-10 DIAGNOSIS — Z0000 Encounter for general adult medical examination without abnormal findings: Secondary | ICD-10-CM

## 2015-01-10 DIAGNOSIS — R5383 Other fatigue: Secondary | ICD-10-CM

## 2015-01-10 DIAGNOSIS — Z1151 Encounter for screening for human papillomavirus (HPV): Secondary | ICD-10-CM | POA: Diagnosis not present

## 2015-01-10 DIAGNOSIS — Z01411 Encounter for gynecological examination (general) (routine) with abnormal findings: Secondary | ICD-10-CM | POA: Diagnosis present

## 2015-01-10 DIAGNOSIS — Z113 Encounter for screening for infections with a predominantly sexual mode of transmission: Secondary | ICD-10-CM | POA: Diagnosis present

## 2015-01-10 DIAGNOSIS — D509 Iron deficiency anemia, unspecified: Secondary | ICD-10-CM

## 2015-01-10 DIAGNOSIS — Z1231 Encounter for screening mammogram for malignant neoplasm of breast: Secondary | ICD-10-CM

## 2015-01-10 DIAGNOSIS — R87810 Cervical high risk human papillomavirus (HPV) DNA test positive: Secondary | ICD-10-CM | POA: Diagnosis not present

## 2015-01-10 LAB — VITAMIN D 25 HYDROXY (VIT D DEFICIENCY, FRACTURES): VITD: 21.63 ng/mL — AB (ref 30.00–100.00)

## 2015-01-10 LAB — VITAMIN B12: VITAMIN B 12: 424 pg/mL (ref 211–911)

## 2015-01-10 LAB — FOLLICLE STIMULATING HORMONE: FSH: 80.8 m[IU]/mL

## 2015-01-10 LAB — LUTEINIZING HORMONE: LH: 38.88 m[IU]/mL

## 2015-01-10 NOTE — Assessment & Plan Note (Signed)
Reviewed preventive care protocols, scheduled due services, and updated immunizations Discussed nutrition, exercise, diet, and healthy lifestyle.  Pap smear today.  Flu vaccine today.

## 2015-01-10 NOTE — Assessment & Plan Note (Signed)
Likely multifactorial. CBC, TSH, CMET all reassuring. Additional labs today. The patient indicates understanding of these issues and agrees with the plan.  Orders Placed This Encounter  Procedures  . Vitamin B12  . Vitamin D, 25-hydroxy  . Follicle Stimulating Hormone  . Luteinizing hormone

## 2015-01-10 NOTE — Progress Notes (Addendum)
Subjective:    Patient ID: Sabrina Randall, female    DOB: 1976-03-26, 39 y.o.   MRN: [id number]  HPI 39 yo G5P2 here for CPX with complaint of fatigue. No h/o abnormal pap smears.  Last pap smear done by me 07/29/13- HPV positive but cytology negative.  Mammogram scheduled for next week.  Denies any dysuria or vaginal discharge. Sexually active with husband only.   No family h/o breast, cervical or uterine cancer that she is aware of.  Has been more tired lately and having hot flashes.  Denied feeling depressed or anxious. Bowels moving ok.  Lab Results  Component Value Date   CHOL 203* 01/03/2015   HDL 55.10 01/03/2015   LDLCALC 127* 01/03/2015   LDLDIRECT 133.5 04/24/2011   TRIG 103.0 01/03/2015   CHOLHDL 4 01/03/2015   Lab Results  Component Value Date   WBC 4.9 01/03/2015   HGB 12.0 01/03/2015   HCT 37.9 01/03/2015   MCV 77.2* 01/03/2015   PLT 323.0 01/03/2015   Lab Results  Component Value Date   NA 139 01/03/2015   K 3.7 01/03/2015   CL 103 01/03/2015   CO2 26 01/03/2015   Lab Results  Component Value Date   CREATININE 0.57 01/03/2015     Patient Active Problem List   Diagnosis Date Noted  . Fatigue 01/10/2015  . Pap normal, but positive HRHPV 07/29/2013  . Mass of both axillae 07/29/2013  . Anemia, iron deficiency 05/09/2013  . Routine general medical examination at a health care facility 04/24/2011   No past medical history on file. No past surgical history on file. Social History  Substance Use Topics  . Smoking status: Former Games developer  . Smokeless tobacco: Never Used  . Alcohol Use: Yes   No family history on file. No Known Allergies Current Outpatient Prescriptions on File Prior to Visit  Medication Sig Dispense Refill  . ibuprofen (ADVIL,MOTRIN) 800 MG tablet Take 1 tablet (800 mg total) by mouth 3 (three) times daily. 21 tablet 0  . Multiple Vitamin (MULTIVITAMIN WITH MINERALS) TABS Take 1 tablet by mouth daily.     No current  facility-administered medications on file prior to visit.   The PMH, PSH, Social History, Family History, Medications, and allergies have been reviewed in [facility], and have been updated if relevant.    Review of Systems  Constitutional: Positive for fatigue. Negative for fever.  HENT: Negative.   Eyes: Negative.   Respiratory: Negative.   Cardiovascular: Negative.   Gastrointestinal: Negative.   Endocrine: Positive for polyphagia.  Genitourinary: Negative.   Musculoskeletal: Negative.   Skin: Negative.   Allergic/Immunologic: Negative.   Neurological: Positive for light-headedness.  Hematological: Negative.   Psychiatric/Behavioral: Negative.   All other systems reviewed and are negative.      Objective:   Physical Exam BP 100/68 mmHg  Pulse 76  Temp(Src) 98.2 F (36.8 C) (Oral)  Ht 4' 11.25" (1.505 m)  Wt 121 lb (54.885 kg)  BMI 24.23 kg/m2  SpO2 98%  LMP 12/21/2014 (Within Weeks)   General:  Well-developed,well-nourished,in no acute distress; alert,appropriate and cooperative throughout examination Head:  normocephalic and atraumatic.   Eyes:  vision grossly intact, pupils equal, pupils round, and pupils reactive to light.   Ears:  R ear normal and L ear normal.   Nose:  no external deformity.   Mouth:  good dentition.   Neck:  No deformities, masses, or tenderness noted. Breasts:  No mass, nodules, thickening, tenderness, bulging, retraction, inflamation, nipple discharge or  skin changes noted.   Lungs:  Normal respiratory effort, chest expands symmetrically. Lungs are clear to auscultation, no crackles or wheezes. Heart:  Normal rate and regular rhythm. S1 and S2 normal without gallop, murmur, click, rub or other extra sounds. Abdomen:  Bowel sounds positive,abdomen soft and non-tender without masses, organomegaly or hernias noted. Rectal:  no external abnormalities.   Genitalia:  Pelvic Exam:        External: normal female genitalia without lesions or masses         Vagina: normal without lesions or masses        Cervix: normal without lesions or masses        Adnexa: normal bimanual exam without masses or fullness        Uterus: normal by palpation        Pap smear: performed Msk:  No deformity or scoliosis noted of thoracic or lumbar spine.   Extremities:  No clubbing, cyanosis, edema, or deformity noted with normal full range of motion of all joints.   Neurologic:  alert & oriented X3 and gait normal.   Skin:  Intact without suspicious lesions or rashes Cervical Nodes:  No lymphadenopathy noted Axillary Nodes:  No palpable lymphadenopathy Psych:  Cognition and judgment appear intact. Alert and cooperative with normal attention span and concentration. No apparent delusions, illusions, hallucinations       Assessment & Plan:

## 2015-01-10 NOTE — Addendum Note (Signed)
Addended by: Desmond Dike on: 01/10/2015 01:54 PM   Modules accepted: Orders

## 2015-01-10 NOTE — Progress Notes (Signed)
Pre visit review using our clinic review tool, if applicable. No additional management support is needed unless otherwise documented below in the visit note. 

## 2015-01-10 NOTE — Assessment & Plan Note (Signed)
CBC reassuring- improved

## 2015-01-10 NOTE — Addendum Note (Signed)
Addended by: Dianne Dun on: 01/10/2015 01:33 PM   Modules accepted: Kipp Brood

## 2015-01-12 LAB — CYTOLOGY - PAP

## 2015-01-15 ENCOUNTER — Encounter: Payer: Self-pay | Admitting: Internal Medicine

## 2015-01-16 ENCOUNTER — Encounter: Payer: Self-pay | Admitting: *Deleted

## 2015-01-16 LAB — CERVICOVAGINAL ANCILLARY ONLY: Herpes: NEGATIVE

## 2015-01-17 ENCOUNTER — Ambulatory Visit (HOSPITAL_COMMUNITY)
Admission: RE | Admit: 2015-01-17 | Discharge: 2015-01-17 | Disposition: A | Discharge status: Home / Self Care | Payer: BLUE CROSS/BLUE SHIELD | Source: Ambulatory Visit | Attending: Family Medicine | Admitting: Family Medicine

## 2015-01-17 DIAGNOSIS — Z1231 Encounter for screening mammogram for malignant neoplasm of breast: Secondary | ICD-10-CM | POA: Insufficient documentation

## 2015-01-21 LAB — CERVICOVAGINAL ANCILLARY ONLY
BACTERIAL VAGINITIS: NEGATIVE
Candida vaginitis: NEGATIVE

## 2015-02-12 ENCOUNTER — Ambulatory Visit: Payer: BLUE CROSS/BLUE SHIELD | Admitting: Family Medicine

## 2015-02-21 ENCOUNTER — Encounter: Payer: Self-pay | Admitting: Family Medicine

## 2015-02-21 ENCOUNTER — Ambulatory Visit (INDEPENDENT_AMBULATORY_CARE_PROVIDER_SITE_OTHER): Payer: BLUE CROSS/BLUE SHIELD | Admitting: Family Medicine

## 2015-02-21 VITALS — BP 110/60 | HR 79 | Temp 98.1°F | Wt 123.5 lb

## 2015-02-21 DIAGNOSIS — N951 Menopausal and female climacteric states: Secondary | ICD-10-CM

## 2015-02-21 MED ORDER — CITALOPRAM HYDROBROMIDE 10 MG PO TABS: 10.0000 mg | ORAL_TABLET | Freq: Every day | ORAL | Status: DC

## 2015-02-21 NOTE — Progress Notes (Signed)
Pre visit review using our clinic review tool, if applicable. No additional management support is needed unless otherwise documented below in the visit note. 

## 2015-02-21 NOTE — Patient Instructions (Signed)
Great to see you. Please start celexa 10 mg daily. Call me in a few weeks with an update.

## 2015-02-21 NOTE — Progress Notes (Signed)
Subjective:   Patient ID: Sabrina Randall, female    DOB: 01-01-1976, 39 y.o.   MRN: [id number]  Sabrina Randall is a pleasant 39 y.o. year old female who presents to clinic today with Follow-up  on 02/21/2015  HPI:  Menopausal symptoms- hot flashes, occur mainly during the day. Still having a period once a month but cramping is more severe, flow is "different." Does have "mood swings" around her period- does not want to be around people like she usually does around her period.  Labs- FSH/LH confirm premature ovarian failure.  Recent Results (from the past 2160 hour(s))  CBC with Differential/Platelet     Status: Abnormal   Collection Time: 01/03/15  7:42 AM  Result Value Ref Range   WBC 4.9 4.0 - 10.5 K/uL   RBC 4.91 3.87 - 5.11 Mil/uL   Hemoglobin 12.0 12.0 - 15.0 g/dL   HCT 11.9 14.7 - 82.9 %   MCV 77.2 (L) 78.0 - 100.0 fl   MCHC 31.8 30.0 - 36.0 g/dL   RDW 56.2 (H) 13.0 - 86.5 %   Platelets 323.0 150.0 - 400.0 K/uL   Neutrophils Relative % 56.0 43.0 - 77.0 %   Lymphocytes Relative 36.0 12.0 - 46.0 %   Monocytes Relative 6.8 3.0 - 12.0 %   Eosinophils Relative 0.8 0.0 - 5.0 %   Basophils Relative 0.4 0.0 - 3.0 %   Neutro Abs 2.7 1.4 - 7.7 K/uL   Lymphs Abs 1.8 0.7 - 4.0 K/uL   Monocytes Absolute 0.3 0.1 - 1.0 K/uL   Eosinophils Absolute 0.0 0.0 - 0.7 K/uL   Basophils Absolute 0.0 0.0 - 0.1 K/uL  Comprehensive metabolic panel     Status: None   Collection Time: 01/03/15  7:42 AM  Result Value Ref Range   Sodium 139 135 - 145 mEq/L   Potassium 3.7 3.5 - 5.1 mEq/L   Chloride 103 96 - 112 mEq/L   CO2 26 19 - 32 mEq/L   Glucose, Bld 81 70 - 99 mg/dL   BUN 13 6 - 23 mg/dL   Creatinine, Ser 7.84 0.40 - 1.20 mg/dL   Total Bilirubin 0.5 0.2 - 1.2 mg/dL   Alkaline Phosphatase 92 39 - 117 U/L   AST 21 0 - 37 U/L   ALT 18 0 - 35 U/L   Total Protein 7.7 6.0 - 8.3 g/dL   Albumin 4.3 3.5 - 5.2 g/dL   Calcium 9.6 8.4 - 69.6 mg/dL   GFR 295.28 >41.32 mL/min  Lipid panel      Status: Abnormal   Collection Time: 01/03/15  7:42 AM  Result Value Ref Range   Cholesterol 203 (H) 0 - 200 mg/dL    Comment: ATP III Classification       Desirable:  < 200 mg/dL               Borderline High:  200 - 239 mg/dL          High:  > = 440 mg/dL   Triglycerides 102.7 0.0 - 149.0 mg/dL    Comment: Normal:  <253 mg/dLBorderline High:  150 - 199 mg/dL   HDL 66.44 >03.47 mg/dL   VLDL 42.5 0.0 - 95.6 mg/dL   LDL Cholesterol 387 (H) 0 - 99 mg/dL   Total CHOL/HDL Ratio 4     Comment:                Men          Women1/2 Average  Risk     3.4          3.3Average Risk          5.0          4.42X Average Risk          9.6          7.13X Average Risk          15.0          11.0                       NonHDL 147.95     Comment: NOTE:  Non-HDL goal should be 30 mg/dL higher than patient's LDL goal (i.e. LDL goal of < 70 mg/dL, would have non-HDL goal of < 100 mg/dL)  TSH     Status: None   Collection Time: 01/03/15  7:42 AM  Result Value Ref Range   TSH 0.78 0.35 - 4.50 uIU/mL  HIV antibody (with reflex)     Status: None   Collection Time: 01/03/15  7:42 AM  Result Value Ref Range   HIV 1&2 Ab, 4th Generation NONREACTIVE NONREACTIVE    Comment:   HIV-1 antigen and HIV-1/HIV-2 antibodies were not detected.  There is no laboratory evidence of HIV infection.   HIV-1/2 Antibody Diff        Not indicated. HIV-1 RNA, Qual TMA          Not indicated.     PLEASE NOTE: This information has been disclosed to you from records whose confidentiality may be protected by state law. If your state requires such protection, then the state law prohibits you from making any further disclosure of the information without the specific written consent of the person to whom it pertains, or as otherwise permitted by law. A general authorization for the release of medical or other information is NOT sufficient for this purpose.   The performance of this assay has not been clinically validated in patients  less than 3 years old.   For additional information please refer to http://education.[employer].com/faq/FAQ106.  (This link is being provided for informational/educational purposes only.)     Cytology - PAP [place]     Status: None   Collection Time: 01/10/15 12:00 AM  Result Value Ref Range   CYTOLOGY - PAP PAP RESULT   Cervicovaginal ancillary only     Status: None   Collection Time: 01/10/15 12:00 AM  Result Value Ref Range   Herpes NEGATIVE for HSV 1 & 2     Comment: Normal Reference Range - Negative  Cervicovaginal ancillary only     Status: None   Collection Time: 01/10/15 12:00 AM  Result Value Ref Range   Bacterial vaginitis Negative for Bacterial Vaginitis Microorganisms     Comment: Normal Reference Range - Negative   Candida vaginitis Negative for Candida Vaginitis Microorganisms     Comment: Normal Reference Range - Negative  Vitamin B12     Status: None   Collection Time: 01/10/15  1:52 PM  Result Value Ref Range   Vitamin B-12 424 211 - 911 pg/mL  Vitamin D, 25-hydroxy     Status: Abnormal   Collection Time: 01/10/15  1:52 PM  Result Value Ref Range   VITD 21.63 (L) 30.00 - 100.00 ng/mL  Follicle Stimulating Hormone     Status: None   Collection Time: 01/10/15  1:52 PM  Result Value Ref Range   FSH 80.8 mIU/ML    Comment:  Female Reference Range:  1.4-18.1 mIU/mLFemale Reference Range:Follicular Phase          2.5-10.2 mIU/mLMidCycle Peak          3.4-33.4 mIU/mLLuteal Phase          1.5-9.1 mIU/mLPost Menopausal     23.0-116.3 mIU/mLPregnant          <0.3 mIU/mL  Luteinizing hormone     Status: None   Collection Time: 01/10/15  1:52 PM  Result Value Ref Range   LH 38.88 mIU/mL    Comment: Female Reference Range:20-70 yrs     1.5-9.3 mIU/mL>70 yrs       3.1-35.6 mIU/mLFemale Reference Range:Follicular Phase     1.9-12.5 mIU/mLMidcycle             8.7-76.3 mIU/mLLuteal Phase         0.5-16.9 mIU/mL  Post Menopausal      15.9-54.0  mIU/mLPregnant              <1.5 mIU/mLContraceptives       0.7-5.6 mIU/mL       Review of Systems  Constitutional: Negative.   Cardiovascular: Negative.   Gastrointestinal: Negative.   Endocrine: Positive for heat intolerance.  Genitourinary: Positive for menstrual problem. Negative for vaginal pain.  Neurological: Negative.   Psychiatric/Behavioral: Positive for sleep disturbance and dysphoric mood. Negative for suicidal ideas, hallucinations, behavioral problems, confusion, self-injury and decreased concentration. The patient is not nervous/anxious.   All other systems reviewed and are negative.      Objective:    BP 110/60 mmHg  Pulse 79  Temp(Src) 98.1 F (36.7 C) (Oral)  Wt 123 lb 8 oz (56.019 kg)  SpO2 98%  LMP 01/24/2015   Physical Exam  Constitutional: She is oriented to person, place, and time. She appears well-developed and well-nourished. No distress.  HENT:  Head: Normocephalic.  Eyes: Conjunctivae are normal.  Cardiovascular: Normal rate.   Pulmonary/Chest: Effort normal.  Musculoskeletal: Normal range of motion.  Neurological: She is alert and oriented to person, place, and time. No cranial nerve deficit.  Skin: Skin is warm and dry.  Psychiatric: She has a normal mood and affect. Her behavior is normal. Judgment and thought content normal.  Nursing note and vitals reviewed.         Assessment & Plan:   Menopausal symptoms No Follow-up on file.

## 2015-02-21 NOTE — Assessment & Plan Note (Signed)
New- progressing. >25 minutes spent in face to face time with patient, >50% spent in counselling or coordination of care Discussed tx options- she does want to try SSRI- eRx sent for celexa 10 mg daily. She will call me in 3 weeks with an update.

## 2016-01-16 ENCOUNTER — Other Ambulatory Visit (HOSPITAL_COMMUNITY)
Admission: RE | Admit: 2016-01-16 | Discharge: 2016-01-16 | Disposition: A | Discharge status: Home / Self Care | Payer: BLUE CROSS/BLUE SHIELD | Source: Ambulatory Visit | Attending: Family Medicine | Admitting: Family Medicine

## 2016-01-16 ENCOUNTER — Ambulatory Visit (INDEPENDENT_AMBULATORY_CARE_PROVIDER_SITE_OTHER): Payer: BLUE CROSS/BLUE SHIELD | Admitting: Family Medicine

## 2016-01-16 VITALS — BP 108/60 | HR 66 | Temp 98.3°F | Ht 60.0 in | Wt 121.8 lb

## 2016-01-16 DIAGNOSIS — Z113 Encounter for screening for infections with a predominantly sexual mode of transmission: Secondary | ICD-10-CM | POA: Diagnosis present

## 2016-01-16 DIAGNOSIS — Z1151 Encounter for screening for human papillomavirus (HPV): Secondary | ICD-10-CM | POA: Insufficient documentation

## 2016-01-16 DIAGNOSIS — R87610 Atypical squamous cells of undetermined significance on cytologic smear of cervix (ASC-US): Secondary | ICD-10-CM | POA: Diagnosis not present

## 2016-01-16 DIAGNOSIS — R87810 Cervical high risk human papillomavirus (HPV) DNA test positive: Secondary | ICD-10-CM | POA: Diagnosis not present

## 2016-01-16 DIAGNOSIS — Z01419 Encounter for gynecological examination (general) (routine) without abnormal findings: Secondary | ICD-10-CM | POA: Insufficient documentation

## 2016-01-16 DIAGNOSIS — D509 Iron deficiency anemia, unspecified: Secondary | ICD-10-CM | POA: Diagnosis not present

## 2016-01-16 DIAGNOSIS — Z23 Encounter for immunization: Secondary | ICD-10-CM

## 2016-01-16 DIAGNOSIS — N760 Acute vaginitis: Secondary | ICD-10-CM | POA: Insufficient documentation

## 2016-01-16 DIAGNOSIS — Z0000 Encounter for general adult medical examination without abnormal findings: Secondary | ICD-10-CM | POA: Insufficient documentation

## 2016-01-16 HISTORY — DX: Atypical squamous cells of undetermined significance on cytologic smear of cervix (ASC-US): R87.610

## 2016-01-16 LAB — LIPID PANEL
CHOL/HDL RATIO: 4
CHOLESTEROL: 224 mg/dL — AB (ref 0–200)
HDL: 62.3 mg/dL (ref >39.00)
LDL CALC: 137 mg/dL — AB (ref 0–99)
NonHDL: 161.77
TRIGLYCERIDES: 122 mg/dL (ref 0.0–149.0)
VLDL: 24.4 mg/dL (ref 0.0–40.0)

## 2016-01-16 LAB — CBC WITH DIFFERENTIAL/PLATELET
BASOS ABS: 0 10*3/uL (ref 0.0–0.1)
BASOS PCT: 0.3 % (ref 0.0–3.0)
EOS ABS: 0 10*3/uL (ref 0.0–0.7)
Eosinophils Relative: 0.8 % (ref 0.0–5.0)
HEMATOCRIT: 41.4 % (ref 36.0–46.0)
HEMOGLOBIN: 13.7 g/dL (ref 12.0–15.0)
LYMPHS PCT: 38.6 % (ref 12.0–46.0)
Lymphs Abs: 2.3 10*3/uL (ref 0.7–4.0)
MCHC: 33.1 g/dL (ref 30.0–36.0)
MCV: 85.3 fl (ref 78.0–100.0)
MONO ABS: 0.4 10*3/uL (ref 0.1–1.0)
Monocytes Relative: 7.1 % (ref 3.0–12.0)
Neutro Abs: 3.1 10*3/uL (ref 1.4–7.7)
Neutrophils Relative %: 53.2 % (ref 43.0–77.0)
Platelets: 323 10*3/uL (ref 150.0–400.0)
RBC: 4.86 Mil/uL (ref 3.87–5.11)
RDW: 16.2 % — AB (ref 11.5–15.5)
WBC: 5.9 10*3/uL (ref 4.0–10.5)

## 2016-01-16 LAB — COMPREHENSIVE METABOLIC PANEL
ALBUMIN: 4.1 g/dL (ref 3.5–5.2)
ALT: 16 U/L (ref 0–35)
AST: 20 U/L (ref 0–37)
Alkaline Phosphatase: 91 U/L (ref 39–117)
BILIRUBIN TOTAL: 0.7 mg/dL (ref 0.2–1.2)
BUN: 10 mg/dL (ref 6–23)
CALCIUM: 9.2 mg/dL (ref 8.4–10.5)
CHLORIDE: 103 meq/L (ref 96–112)
CO2: 29 mEq/L (ref 19–32)
CREATININE: 0.61 mg/dL (ref 0.40–1.20)
GFR: 114.8 mL/min (ref >60.00)
Glucose, Bld: 76 mg/dL (ref 70–99)
Potassium: 3.8 mEq/L (ref 3.5–5.1)
SODIUM: 139 meq/L (ref 135–145)
Total Protein: 7.6 g/dL (ref 6.0–8.3)

## 2016-01-16 LAB — TSH: TSH: 1.04 u[IU]/mL (ref 0.35–4.50)

## 2016-01-16 NOTE — Progress Notes (Signed)
Pre visit review using our clinic review tool, if applicable. No additional management support is needed unless otherwise documented below in the visit note. 

## 2016-01-16 NOTE — Assessment & Plan Note (Signed)
Pap smear repeated today. 

## 2016-01-16 NOTE — Progress Notes (Signed)
Subjective:    Patient ID: Sabrina Randall, female    DOB: March 25, 1976, 40 y.o.   MRN: [id number]  HPI 40 yo G5P2 here for CPX.Marland Kitchen Last pap smear 01/10/15- ASCUS  Mammogram 01/26/15  Denies any dysuria or vaginal discharge. Sexually active with husband only.   No family h/o breast, cervical or uterine cancer that she is aware of.    Lab Results  Component Value Date   CHOL 203 (H) 01/03/2015   HDL 55.10 01/03/2015   LDLCALC 127 (H) 01/03/2015   LDLDIRECT 133.5 04/24/2011   TRIG 103.0 01/03/2015   CHOLHDL 4 01/03/2015   Lab Results  Component Value Date   WBC 4.9 01/03/2015   HGB 12.0 01/03/2015   HCT 37.9 01/03/2015   MCV 77.2 (L) 01/03/2015   PLT 323.0 01/03/2015   Lab Results  Component Value Date   NA 139 01/03/2015   K 3.7 01/03/2015   CL 103 01/03/2015   CO2 26 01/03/2015   Lab Results  Component Value Date   CREATININE 0.57 01/03/2015     Patient Active Problem List  Diagnosis  . Anemia, iron deficiency  . Menopausal symptoms  . Papanicolaou smear of cervix with atypical squamous cells of undetermined significance (ASC-US)  . Well woman exam with routine gynecological exam   No past medical history on file. No past surgical history on file. Social History  Substance Use Topics  . Smoking status: Former Games developer  . Smokeless tobacco: Never Used  . Alcohol use Yes   No family history on file. No Known Allergies Current Outpatient Prescriptions on File Prior to Visit  Medication Sig Dispense Refill  . citalopram (CELEXA) 10 MG tablet Take 1 tablet (10 mg total) by mouth daily. 30 tablet 3  . ibuprofen (ADVIL,MOTRIN) 800 MG tablet Take 1 tablet (800 mg total) by mouth 3 (three) times daily. 21 tablet 0  . Multiple Vitamin (MULTIVITAMIN WITH MINERALS) TABS Take 1 tablet by mouth daily.     No current facility-administered medications on file prior to visit.    The PMH, PSH, Social History, Family History, Medications, and allergies have been reviewed  in [facility], and have been updated if relevant.    Review of Systems  Constitutional: Negative for fatigue and fever.  HENT: Negative.   Eyes: Negative.   Respiratory: Negative.   Cardiovascular: Negative.   Gastrointestinal: Negative.   Endocrine: Negative for polyphagia.  Genitourinary: Negative.   Musculoskeletal: Negative.   Skin: Negative.   Allergic/Immunologic: Negative.   Neurological: Negative for light-headedness.  Hematological: Negative.   Psychiatric/Behavioral: Negative.   All other systems reviewed and are negative.      Objective:   Physical Exam BP 108/60   Pulse 66   Temp 98.3 F (36.8 C) (Oral)   Ht 5' (1.524 m)   Wt 121 lb 12 oz (55.2 kg)   LMP 01/04/2016   SpO2 98%   BMI 23.78 kg/m    General:  Well-developed,well-nourished,in no acute distress; alert,appropriate and cooperative throughout examination Head:  normocephalic and atraumatic.   Eyes:  vision grossly intact, pupils equal, pupils round, and pupils reactive to light.   Ears:  R ear normal and L ear normal.   Nose:  no external deformity.   Mouth:  good dentition.   Neck:  No deformities, masses, or tenderness noted. Breasts:  No mass, nodules, thickening, tenderness, bulging, retraction, inflamation, nipple discharge or skin changes noted.   Lungs:  Normal respiratory effort, chest expands symmetrically. Lungs are clear to  auscultation, no crackles or wheezes. Heart:  Normal rate and regular rhythm. S1 and S2 normal without gallop, murmur, click, rub or other extra sounds. Abdomen:  Bowel sounds positive,abdomen soft and non-tender without masses, organomegaly or hernias noted. Rectal:  no external abnormalities.   Genitalia:  Pelvic Exam:        External: normal female genitalia without lesions or masses        Vagina: normal without lesions or masses        Cervix: normal without lesions or masses        Adnexa: normal bimanual exam without masses or fullness        Uterus: normal by  palpation        Pap smear: performed Msk:  No deformity or scoliosis noted of thoracic or lumbar spine.   Extremities:  No clubbing, cyanosis, edema, or deformity noted with normal full range of motion of all joints.   Neurologic:  alert & oriented X3 and gait normal.   Skin:  Intact without suspicious lesions or rashes Cervical Nodes:  No lymphadenopathy noted Axillary Nodes:  No palpable lymphadenopathy Psych:  Cognition and judgment appear intact. Alert and cooperative with normal attention span and concentration. No apparent delusions, illusions, hallucinations       Assessment & Plan:

## 2016-01-16 NOTE — Assessment & Plan Note (Signed)
Reviewed preventive care protocols, scheduled due services, and updated immunizations Discussed nutrition, exercise, diet, and healthy lifestyle.  Orders Placed This Encounter  Procedures  . CBC with Differential/Platelet  . Comprehensive metabolic panel  . Lipid panel  . TSH   Influenza vaccine given.

## 2016-01-16 NOTE — Patient Instructions (Signed)
Great to see you. Please schedule your mammogram.  We will call you with your results and you can view them online. 

## 2016-01-16 NOTE — Addendum Note (Signed)
Addended by: Desmond DikeKNIGHT, Delvin Hedeen H on: 01/16/2016 10:34 AM   Modules accepted: Orders

## 2016-01-17 ENCOUNTER — Encounter: Payer: Self-pay | Admitting: *Deleted

## 2016-01-17 LAB — CYTOLOGY - PAP

## 2016-01-21 LAB — CERVICOVAGINAL ANCILLARY ONLY
Bacterial vaginitis: NEGATIVE
CANDIDA VAGINITIS: NEGATIVE

## 2016-01-22 LAB — CERVICOVAGINAL ANCILLARY ONLY: HERPES (WINDOWPATH): NEGATIVE

## 2016-01-23 ENCOUNTER — Encounter: Payer: Self-pay | Admitting: *Deleted

## 2016-01-23 ENCOUNTER — Other Ambulatory Visit: Payer: Self-pay | Admitting: Family Medicine

## 2016-01-23 DIAGNOSIS — Z1231 Encounter for screening mammogram for malignant neoplasm of breast: Secondary | ICD-10-CM

## 2016-02-06 ENCOUNTER — Ambulatory Visit
Admission: RE | Admit: 2016-02-06 | Discharge: 2016-02-06 | Disposition: A | Discharge status: Home / Self Care | Payer: BLUE CROSS/BLUE SHIELD | Source: Ambulatory Visit | Attending: Family Medicine | Admitting: Family Medicine

## 2016-02-06 DIAGNOSIS — Z1231 Encounter for screening mammogram for malignant neoplasm of breast: Secondary | ICD-10-CM

## 2017-01-05 ENCOUNTER — Other Ambulatory Visit: Payer: Self-pay | Admitting: Family Medicine

## 2017-01-05 DIAGNOSIS — Z01419 Encounter for gynecological examination (general) (routine) without abnormal findings: Secondary | ICD-10-CM

## 2017-01-14 ENCOUNTER — Other Ambulatory Visit (INDEPENDENT_AMBULATORY_CARE_PROVIDER_SITE_OTHER): Payer: BLUE CROSS/BLUE SHIELD

## 2017-01-14 DIAGNOSIS — Z01419 Encounter for gynecological examination (general) (routine) without abnormal findings: Secondary | ICD-10-CM

## 2017-01-14 LAB — CBC WITH DIFFERENTIAL/PLATELET
BASOS ABS: 0 10*3/uL (ref 0.0–0.1)
Basophils Relative: 0.4 % (ref 0.0–3.0)
EOS ABS: 0.1 10*3/uL (ref 0.0–0.7)
Eosinophils Relative: 1.1 % (ref 0.0–5.0)
HEMATOCRIT: 38.7 % (ref 36.0–46.0)
Hemoglobin: 12 g/dL (ref 12.0–15.0)
LYMPHS ABS: 1.6 10*3/uL (ref 0.7–4.0)
Lymphocytes Relative: 29.3 % (ref 12.0–46.0)
MCHC: 31.1 g/dL (ref 30.0–36.0)
MCV: 75 fl — ABNORMAL LOW (ref 78.0–100.0)
MONO ABS: 0.4 10*3/uL (ref 0.1–1.0)
MONOS PCT: 7.7 % (ref 3.0–12.0)
NEUTROS ABS: 3.4 10*3/uL (ref 1.4–7.7)
NEUTROS PCT: 61.5 % (ref 43.0–77.0)
PLATELETS: 373 10*3/uL (ref 150.0–400.0)
RBC: 5.15 Mil/uL — ABNORMAL HIGH (ref 3.87–5.11)
RDW: 18.2 % — ABNORMAL HIGH (ref 11.5–15.5)
WBC: 5.5 10*3/uL (ref 4.0–10.5)

## 2017-01-14 LAB — COMPREHENSIVE METABOLIC PANEL
ALT: 15 U/L (ref 0–35)
AST: 18 U/L (ref 0–37)
Albumin: 4.4 g/dL (ref 3.5–5.2)
Alkaline Phosphatase: 84 U/L (ref 39–117)
BUN: 15 mg/dL (ref 6–23)
CO2: 27 meq/L (ref 19–32)
Calcium: 10.1 mg/dL (ref 8.4–10.5)
Chloride: 102 mEq/L (ref 96–112)
Creatinine, Ser: 0.59 mg/dL (ref 0.40–1.20)
GFR: 118.72 mL/min (ref >60.00)
GLUCOSE: 82 mg/dL (ref 70–99)
POTASSIUM: 3.9 meq/L (ref 3.5–5.1)
Sodium: 139 mEq/L (ref 135–145)
Total Bilirubin: 0.8 mg/dL (ref 0.2–1.2)
Total Protein: 7.7 g/dL (ref 6.0–8.3)

## 2017-01-14 LAB — LIPID PANEL
CHOL/HDL RATIO: 3
Cholesterol: 235 mg/dL — ABNORMAL HIGH (ref 0–200)
HDL: 69 mg/dL (ref >39.00)
LDL Cholesterol: 139 mg/dL — ABNORMAL HIGH (ref 0–99)
NONHDL: 166.08
Triglycerides: 137 mg/dL (ref 0.0–149.0)
VLDL: 27.4 mg/dL (ref 0.0–40.0)

## 2017-01-14 LAB — TSH: TSH: 0.85 u[IU]/mL (ref 0.35–4.50)

## 2017-01-21 ENCOUNTER — Ambulatory Visit (INDEPENDENT_AMBULATORY_CARE_PROVIDER_SITE_OTHER): Payer: BLUE CROSS/BLUE SHIELD | Admitting: Family Medicine

## 2017-01-21 VITALS — BP 100/80 | HR 71 | Ht 59.5 in | Wt 120.0 lb

## 2017-01-21 DIAGNOSIS — Z0000 Encounter for general adult medical examination without abnormal findings: Secondary | ICD-10-CM | POA: Diagnosis not present

## 2017-01-21 NOTE — Assessment & Plan Note (Signed)
Reviewed preventive care protocols, scheduled due services, and updated immunizations Discussed nutrition, exercise, diet, and healthy lifestyle.  Influenza vaccine given 

## 2017-01-21 NOTE — Progress Notes (Signed)
Subjective:    Patient ID: Sabrina Randall, female    DOB: 25-Aug-1975, 41 y.o.   MRN: [id number]  41 yo pleasant G5P2 here for CPX. Last pap smear 01/16/16- normal. Pap smear the year prior, 01/10/15-- ASCUS  Mammogram 02/06/16  Denies any dysuria or vaginal discharge. Sexually active with husband only.   No family h/o breast, cervical or uterine cancer that she is aware of.    Lab Results  Component Value Date   CHOL 235 (H) 01/14/2017   HDL 69.00 01/14/2017   LDLCALC 139 (H) 01/14/2017   LDLDIRECT 133.5 04/24/2011   TRIG 137.0 01/14/2017   CHOLHDL 3 01/14/2017   Lab Results  Component Value Date   WBC 5.5 01/14/2017   HGB 12.0 01/14/2017   HCT 38.7 01/14/2017   MCV 75.0 (L) 01/14/2017   PLT 373.0 01/14/2017   Lab Results  Component Value Date   NA 139 01/14/2017   K 3.9 01/14/2017   CL 102 01/14/2017   CO2 27 01/14/2017   Lab Results  Component Value Date   CREATININE 0.59 01/14/2017     Patient Active Problem List   Diagnosis Date Noted  . Papanicolaou smear of cervix with atypical squamous cells of undetermined significance (ASC-US) 01/16/2016  . Well woman exam without gynecological exam 01/16/2016  . Menopausal symptoms 02/21/2015  . Anemia, iron deficiency 05/09/2013   No past medical history on file. No past surgical history on file. Social History  Substance Use Topics  . Smoking status: Former Games developer  . Smokeless tobacco: Never Used  . Alcohol use Yes   No family history on file. No Known Allergies No current outpatient prescriptions on file prior to visit.   No current facility-administered medications on file prior to visit.    The PMH, PSH, Social History, Family History, Medications, and allergies have been reviewed in [facility], and have been updated if relevant.    Review of Systems  Constitutional: Negative for fatigue and fever.  HENT: Negative.   Eyes: Negative.   Respiratory: Negative.   Cardiovascular: Negative.    Gastrointestinal: Negative.   Endocrine: Negative for polyphagia.  Genitourinary: Negative.   Musculoskeletal: Negative.   Skin: Negative.   Allergic/Immunologic: Negative.   Neurological: Negative for light-headedness.  Hematological: Negative.   Psychiatric/Behavioral: Negative.   All other systems reviewed and are negative.      Objective:   Physical Exam BP 100/80   Pulse 71   Ht 4' 11.5" (1.511 m)   Wt 120 lb (54.4 kg)   SpO2 98%   BMI 23.83 kg/m    General:  Well-developed,well-nourished,in no acute distress; alert,appropriate and cooperative throughout examination Head:  normocephalic and atraumatic.   Eyes:  vision grossly intact, PERRL Ears:  R ear normal and L ear normal externally, TMs clear bilaterally Nose:  no external deformity.   Mouth:  good dentition.   Neck:  No deformities, masses, or tenderness noted. Breasts:  No mass, nodules, thickening, tenderness, bulging, retraction, inflamation, nipple discharge or skin changes noted.   Lungs:  Normal respiratory effort, chest expands symmetrically. Lungs are clear to auscultation, no crackles or wheezes. Heart:  Normal rate and regular rhythm. S1 and S2 normal without gallop, murmur, click, rub or other extra sounds. Abdomen:  Bowel sounds positive,abdomen soft and non-tender without masses, organomegaly or hernias noted. Msk:  No deformity or scoliosis noted of thoracic or lumbar spine.   Extremities:  No clubbing, cyanosis, edema, or deformity noted with normal full range of motion of  all joints.   Neurologic:  alert & oriented X3 and gait normal.   Skin:  Intact without suspicious lesions or rashes Cervical Nodes:  No lymphadenopathy noted Axillary Nodes:  No palpable lymphadenopathy Psych:  Cognition and judgment appear intact. Alert and cooperative with normal attention span and concentration. No apparent delusions, illusions, hallucinations       Assessment & Plan:

## 2017-01-21 NOTE — Patient Instructions (Signed)
Great to see you. Please call to schedule your mammogram. 

## 2017-02-18 ENCOUNTER — Other Ambulatory Visit: Payer: Self-pay | Admitting: Family Medicine

## 2017-02-18 DIAGNOSIS — Z1231 Encounter for screening mammogram for malignant neoplasm of breast: Secondary | ICD-10-CM

## 2017-03-25 ENCOUNTER — Ambulatory Visit: Payer: BLUE CROSS/BLUE SHIELD

## 2017-06-10 ENCOUNTER — Ambulatory Visit
Admission: RE | Admit: 2017-06-10 | Discharge: 2017-06-10 | Disposition: A | Discharge status: Home / Self Care | Payer: BLUE CROSS/BLUE SHIELD | Source: Ambulatory Visit | Attending: Family Medicine | Admitting: Family Medicine

## 2017-06-10 DIAGNOSIS — Z1231 Encounter for screening mammogram for malignant neoplasm of breast: Secondary | ICD-10-CM

## 2017-09-25 ENCOUNTER — Ambulatory Visit: Payer: BLUE CROSS/BLUE SHIELD | Admitting: Psychology

## 2017-09-25 DIAGNOSIS — F411 Generalized anxiety disorder: Secondary | ICD-10-CM | POA: Diagnosis not present

## 2017-10-08 ENCOUNTER — Ambulatory Visit: Payer: BLUE CROSS/BLUE SHIELD | Admitting: Psychology

## 2018-01-27 ENCOUNTER — Encounter: Payer: BLUE CROSS/BLUE SHIELD | Admitting: Family Medicine

## 2018-03-17 ENCOUNTER — Other Ambulatory Visit (HOSPITAL_COMMUNITY)
Admission: RE | Admit: 2018-03-17 | Discharge: 2018-03-17 | Disposition: A | Discharge status: Home / Self Care | Payer: BLUE CROSS/BLUE SHIELD | Source: Ambulatory Visit | Attending: Family Medicine | Admitting: Family Medicine

## 2018-03-17 ENCOUNTER — Ambulatory Visit (INDEPENDENT_AMBULATORY_CARE_PROVIDER_SITE_OTHER): Payer: BLUE CROSS/BLUE SHIELD | Admitting: Family Medicine

## 2018-03-17 ENCOUNTER — Encounter: Payer: Self-pay | Admitting: Family Medicine

## 2018-03-17 VITALS — BP 118/64 | HR 68 | Temp 98.2°F | Ht 59.5 in | Wt 121.0 lb

## 2018-03-17 DIAGNOSIS — R5383 Other fatigue: Secondary | ICD-10-CM | POA: Diagnosis not present

## 2018-03-17 DIAGNOSIS — Z124 Encounter for screening for malignant neoplasm of cervix: Secondary | ICD-10-CM | POA: Diagnosis not present

## 2018-03-17 DIAGNOSIS — R87610 Atypical squamous cells of undetermined significance on cytologic smear of cervix (ASC-US): Secondary | ICD-10-CM

## 2018-03-17 DIAGNOSIS — Z01419 Encounter for gynecological examination (general) (routine) without abnormal findings: Secondary | ICD-10-CM | POA: Diagnosis not present

## 2018-03-17 DIAGNOSIS — E785 Hyperlipidemia, unspecified: Secondary | ICD-10-CM | POA: Diagnosis not present

## 2018-03-17 DIAGNOSIS — D508 Other iron deficiency anemias: Secondary | ICD-10-CM

## 2018-03-17 LAB — VITAMIN D 25 HYDROXY (VIT D DEFICIENCY, FRACTURES): VITD: 17.73 ng/mL — AB (ref 30.00–100.00)

## 2018-03-17 LAB — COMPREHENSIVE METABOLIC PANEL
ALBUMIN: 4.4 g/dL (ref 3.5–5.2)
ALK PHOS: 87 U/L (ref 39–117)
ALT: 18 U/L (ref 0–35)
AST: 20 U/L (ref 0–37)
BILIRUBIN TOTAL: 0.9 mg/dL (ref 0.2–1.2)
BUN: 11 mg/dL (ref 6–23)
CO2: 29 mEq/L (ref 19–32)
Calcium: 9.7 mg/dL (ref 8.4–10.5)
Chloride: 102 mEq/L (ref 96–112)
Creatinine, Ser: 0.66 mg/dL (ref 0.40–1.20)
GFR: 103.74 mL/min (ref >60.00)
GLUCOSE: 85 mg/dL (ref 70–99)
POTASSIUM: 4.9 meq/L (ref 3.5–5.1)
Sodium: 138 mEq/L (ref 135–145)
TOTAL PROTEIN: 7.4 g/dL (ref 6.0–8.3)

## 2018-03-17 LAB — LIPID PANEL
CHOLESTEROL: 243 mg/dL — AB (ref 0–200)
HDL: 66.7 mg/dL (ref >39.00)
LDL Cholesterol: 141 mg/dL — ABNORMAL HIGH (ref 0–99)
NONHDL: 176.24
TRIGLYCERIDES: 178 mg/dL — AB (ref 0.0–149.0)
Total CHOL/HDL Ratio: 4
VLDL: 35.6 mg/dL (ref 0.0–40.0)

## 2018-03-17 LAB — CBC WITH DIFFERENTIAL/PLATELET
BASOS ABS: 0 10*3/uL (ref 0.0–0.1)
Basophils Relative: 0.7 % (ref 0.0–3.0)
EOS PCT: 1.8 % (ref 0.0–5.0)
Eosinophils Absolute: 0.1 10*3/uL (ref 0.0–0.7)
HCT: 42.5 % (ref 36.0–46.0)
Hemoglobin: 13.6 g/dL (ref 12.0–15.0)
LYMPHS ABS: 1.8 10*3/uL (ref 0.7–4.0)
LYMPHS PCT: 41.3 % (ref 12.0–46.0)
MCHC: 32.1 g/dL (ref 30.0–36.0)
MCV: 84.2 fl (ref 78.0–100.0)
MONOS PCT: 7.3 % (ref 3.0–12.0)
Monocytes Absolute: 0.3 10*3/uL (ref 0.1–1.0)
NEUTROS PCT: 48.9 % (ref 43.0–77.0)
Neutro Abs: 2.1 10*3/uL (ref 1.4–7.7)
Platelets: 308 10*3/uL (ref 150.0–400.0)
RBC: 5.05 Mil/uL (ref 3.87–5.11)
RDW: 16.9 % — ABNORMAL HIGH (ref 11.5–15.5)
WBC: 4.4 10*3/uL (ref 4.0–10.5)

## 2018-03-17 LAB — TSH: TSH: 1.02 u[IU]/mL (ref 0.35–4.50)

## 2018-03-17 LAB — VITAMIN B12: Vitamin B-12: 371 pg/mL (ref 211–911)

## 2018-03-17 LAB — FERRITIN: Ferritin: 5.7 ng/mL — ABNORMAL LOW (ref 10.0–291.0)

## 2018-03-17 NOTE — Assessment & Plan Note (Signed)
Labs today

## 2018-03-17 NOTE — Progress Notes (Signed)
Subjective:   Patient ID: Sabrina Randall, female    DOB: [birth year], 42 y.o.   MRN: [id number]  Kenecia Barren Scobie Bradly Bienenstock is a pleasant 42 y.o. year old female who presents to clinic today with Annual Exam (has been feeling fatigued more lately-she noticed for the past couple of weeks. She states she feels more tired and achy. Has been having intermittent headaches.She is fasting for labs.  )  on 03/17/2018  HPI: 42 yo pleasant G5P2 here for CPX. Last pap smear 01/16/16- normal. Pap smear the year prior, 01/10/15-- ASCUS  Mammogram 05/23/17  Denies any dysuria or vaginal discharge. Sexually active with husband only.   No family h/o breast, cervical or uterine cancer that she is aware of.  Fatigue- noticed it past few weeks.  Has had a few stressors. Sleeping okay but wants to make sure she isn't deficient in anything.  She does have a h/o anemia.  Lab Results  Component Value Date   CHOL 235 (H) 01/14/2017   HDL 69.00 01/14/2017   LDLCALC 139 (H) 01/14/2017   LDLDIRECT 133.5 04/24/2011   TRIG 137.0 01/14/2017   CHOLHDL 3 01/14/2017   Lab Results  Component Value Date   NA 139 01/14/2017   K 3.9 01/14/2017   CL 102 01/14/2017   CO2 27 01/14/2017   Lab Results  Component Value Date   ALT 15 01/14/2017   AST 18 01/14/2017   ALKPHOS 84 01/14/2017   BILITOT 0.8 01/14/2017    No current outpatient medications on file prior to visit.   No current facility-administered medications on file prior to visit.     No Known Allergies  No past medical history on file.  No past surgical history on file.  No family history on file.  Social History   Socioeconomic History  . Marital status: Single    Spouse name: Not on file  . Number of children: Not on file  . Years of education: Not on file  . Highest education level: Not on file  Occupational History  . Not on file  Social Needs  . Financial resource strain: Not on file  . Food insecurity:   Worry: Not on file    Inability: Not on file  . Transportation needs:    Medical: Not on file    Non-medical: Not on file  Tobacco Use  . Smoking status: Former Games developer  . Smokeless tobacco: Never Used  Substance and Sexual Activity  . Alcohol use: Yes  . Drug use: No  . Sexual activity: Not Currently    Partners: Male  Lifestyle  . Physical activity:    Days per week: Not on file    Minutes per session: Not on file  . Stress: Not on file  Relationships  . Social connections:    Talks on phone: Not on file    Gets together: Not on file    Attends religious service: Not on file    Active member of club or organization: Not on file    Attends meetings of clubs or organizations: Not on file    Relationship status: Not on file  . Intimate partner violence:    Fear of current or ex partner: Not on file    Emotionally abused: Not on file    Physically abused: Not on file    Forced sexual activity: Not on file  Other Topics Concern  . Not on file  Social History Narrative    Merged History Encounter  The PMH, PSH, Social History, Family History, Medications, and allergies have been reviewed in [facility], and have been updated if relevant.   . The PMH, PSH, Social History, Family History, Medications, and allergies [facility], and have been updated if relevant.  Review of Systems  Constitutional: Positive for fatigue.  HENT: Negative.   Eyes: Negative.   Respiratory: Negative.   Cardiovascular: Negative.   Gastrointestinal: Negative.   Endocrine: Negative.   Genitourinary: Negative.   Musculoskeletal: Negative.   Allergic/Immunologic: Negative.   Neurological: Negative.   Hematological: Negative.   Psychiatric/Behavioral: Negative.        Objective:    BP 118/64   Pulse 68   Temp 98.2 F (36.8 C) (Oral)   Ht 4' 11.5" (1.511 m)   Wt 121 lb (54.9 kg)   LMP  (LMP Unknown)   SpO2 98%   BMI 24.03 kg/m    Physical Exam   General:   Well-developed,well-nourished,in no acute distress; alert,appropriate and cooperative throughout examination Head:  normocephalic and atraumatic.   Eyes:  vision grossly intact, PERRL Ears:  R ear normal and L ear normal externally, TMs clear bilaterally Nose:  no external deformity.   Mouth:  good dentition.   Neck:  No deformities, masses, or tenderness noted. Breasts:  No mass, nodules, thickening, tenderness, bulging, retraction, inflamation, nipple discharge or skin changes noted.   Lungs:  Normal respiratory effort, chest expands symmetrically. Lungs are clear to auscultation, no crackles or wheezes. Heart:  Normal rate and regular rhythm. S1 and S2 normal without gallop, murmur, click, rub or other extra sounds. Abdomen:  Bowel sounds positive,abdomen soft and non-tender without masses, organomegaly or hernias noted. Rectal:  no external abnormalities.   Genitalia:  Pelvic Exam:        External: normal female genitalia without lesions or masses        Vagina: normal without lesions or masses        Cervix: normal without lesions or masses        Adnexa: normal bimanual exam without masses or fullness        Uterus: normal by palpation        Pap smear: performed Msk:  No deformity or scoliosis noted of thoracic or lumbar spine.   Extremities:  No clubbing, cyanosis, edema, or deformity noted with normal full range of motion of all joints.   Neurologic:  alert & oriented X3 and gait normal.   Skin:  Intact without suspicious lesions or rashes Cervical Nodes:  No lymphadenopathy noted Axillary Nodes:  No palpable lymphadenopathy Psych:  Cognition and judgment appear intact. Alert and cooperative with normal attention span and concentration. No apparent delusions, illusions, hallucinations       Assessment & Plan:   Well woman exam with routine gynecological exam  Hyperlipidemia, unspecified hyperlipidemia type  Papanicolaou smear of cervix with atypical squamous cells of  undetermined significance (ASC-US)  Other iron deficiency anemia No follow-ups on file.

## 2018-03-17 NOTE — Assessment & Plan Note (Signed)
Reviewed preventive care protocols, scheduled due services, and updated immunizations Discussed nutrition, exercise, diet, and healthy lifestyle.  

## 2018-03-17 NOTE — Patient Instructions (Signed)
Great to see you. I will call you with your lab results from today and you can view them online.   I hope you have a great thanksgiving!

## 2018-03-17 NOTE — Assessment & Plan Note (Signed)
Likely multifactorial. Check labs today. 

## 2018-03-24 LAB — CYTOLOGY - PAP
HPV 16/18/45 genotyping: POSITIVE — AB
HPV: DETECTED — AB

## 2018-03-25 ENCOUNTER — Other Ambulatory Visit: Payer: Self-pay | Admitting: Family Medicine

## 2018-03-25 DIAGNOSIS — R87810 Cervical high risk human papillomavirus (HPV) DNA test positive: Secondary | ICD-10-CM

## 2018-03-25 DIAGNOSIS — R87610 Atypical squamous cells of undetermined significance on cytologic smear of cervix (ASC-US): Secondary | ICD-10-CM

## 2018-04-15 ENCOUNTER — Encounter: Payer: Self-pay | Admitting: Obstetrics & Gynecology

## 2018-04-15 ENCOUNTER — Ambulatory Visit (INDEPENDENT_AMBULATORY_CARE_PROVIDER_SITE_OTHER): Payer: BLUE CROSS/BLUE SHIELD

## 2018-04-15 ENCOUNTER — Ambulatory Visit (INDEPENDENT_AMBULATORY_CARE_PROVIDER_SITE_OTHER): Payer: BLUE CROSS/BLUE SHIELD | Admitting: Obstetrics & Gynecology

## 2018-04-15 ENCOUNTER — Encounter: Payer: Self-pay | Admitting: *Deleted

## 2018-04-15 VITALS — BP 91/60 | HR 78 | Ht 59.0 in | Wt 122.0 lb

## 2018-04-15 DIAGNOSIS — Z23 Encounter for immunization: Secondary | ICD-10-CM | POA: Diagnosis not present

## 2018-04-15 DIAGNOSIS — Z1151 Encounter for screening for human papillomavirus (HPV): Secondary | ICD-10-CM

## 2018-04-15 DIAGNOSIS — Z3202 Encounter for pregnancy test, result negative: Secondary | ICD-10-CM

## 2018-04-15 DIAGNOSIS — R87615 Unsatisfactory cytologic smear of cervix: Secondary | ICD-10-CM | POA: Diagnosis not present

## 2018-04-15 DIAGNOSIS — N72 Inflammatory disease of cervix uteri: Secondary | ICD-10-CM | POA: Diagnosis not present

## 2018-04-15 DIAGNOSIS — B977 Papillomavirus as the cause of diseases classified elsewhere: Secondary | ICD-10-CM

## 2018-04-15 DIAGNOSIS — Z124 Encounter for screening for malignant neoplasm of cervix: Secondary | ICD-10-CM

## 2018-04-15 LAB — POCT URINE PREGNANCY: Preg Test, Ur: NEGATIVE

## 2018-04-15 NOTE — Patient Instructions (Signed)
Colposcopy, Care After  This sheet gives you information about how to care for yourself after your procedure. Your health care provider may also give you more specific instructions. If you have problems or questions, contact your health care provider.  What can I expect after the procedure?  If you had a colposcopy without a biopsy, you can expect to feel fine right away, but you may have some spotting for a few days. You can go back to your normal activities.  If you had a colposcopy with a biopsy, it is common to have:   Soreness and pain. This may last for a few days.   Light-headedness.   Mild vaginal bleeding or dark-colored, grainy discharge. This may last for a few days. The discharge may be due to a solution that was used during the procedure. You may need to wear a sanitary pad during this time.   Spotting for at least 48 hours after the procedure.  Follow these instructions at home:     Take over-the-counter and prescription medicines only as told by your health care provider. Talk with your health care provider about what type of over-the-counter pain medicine and prescription medicine you can start taking again. It is especially important to talk with your health care provider if you take blood-thinning medicine.   Do not drive or use heavy machinery while taking prescription pain medicine.   For at least 3 days after your procedure, or as long as told by your health care provider, avoid:  ? Douching.  ? Using tampons.  ? Having sexual intercourse.   Continue to use birth control (contraception).   Limit your physical activity for the first day after the procedure as told by your health care provider. Ask your health care provider what activities are safe for you.   It is up to you to get the results of your procedure. Ask your health care provider, or the department performing the procedure, when your results will be ready.   Keep all follow-up visits as told by your health care provider.  This is important.  Contact a health care provider if:   You develop a skin rash.  Get help right away if:   You are bleeding heavily from your vagina or you are passing blood clots. This includes using more than one sanitary pad per hour for 2 hours in a row.   You have a fever or chills.   You have pelvic pain.   You have abnormal, yellow-colored, or bad-smelling vaginal discharge. This could be a sign of infection.   You have severe pain or cramps in your lower abdomen that do not get better with medicine.   You feel light-headed or dizzy, or you faint.  Summary   If you had a colposcopy without a biopsy, you can expect to feel fine right away, but you may have some spotting for a few days. You can go back to your normal activities.   If you had a colposcopy with a biopsy, you may notice mild pain and spotting for 48 hours after the procedure.   Avoid douching, using tampons, and having sexual intercourse for 3 days after the procedure or as long as told by your health care provider.   Contact your health care provider if you have bleeding, severe pain, or signs of infection.  This information is not intended to replace advice given to you by your health care provider. Make sure you discuss any questions you have with your   health care provider.  Document Released: 01/26/2013 Document Revised: 11/23/2015 Document Reviewed: 11/23/2015  Elsevier Interactive Patient Education  2019 Elsevier Inc.

## 2018-04-15 NOTE — Progress Notes (Signed)
    GYNECOLOGY CLINIC COLPOSCOPY PROCEDURE NOTE  42 y.o. 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 Discussed role for HPV in cervical dysplasia in detail, need for close surveillance.  Patient given informed consent, signed copy in the chart, time out was performed.  Placed in lithotomy position. Cervix viewed with speculum, pap smear was done.  Cervix was then viewed with colposcope after application of acetic acid.   Colposcopy adequate? Yes Acetowhite lesion(s) noted at 6 o'clock; corresponding biopsy obtained.   ECC specimen obtained. All specimens were labeled and sent to pathology.   Patient was given post procedure instructions.  Will follow up pathology and manage accordingly; patient will be contacted with results and recommendations.  Routine preventative health maintenance measures emphasized.    Sabrina CollinsUGONNA  [name], MD, FACOG Obstetrician & Gynecologist, [facility]

## 2018-04-16 ENCOUNTER — Telehealth: Payer: Self-pay | Admitting: Family Medicine

## 2018-04-16 NOTE — Telephone Encounter (Signed)
Copied from CRM [id number]. Topic: Quick Communication - Rx Refill/Question >> Apr 16, 2018  2:06 PM Jilda Rocheemaray, Melissa wrote: Medication: Vitamin D  Has the patient contacted their pharmacy? No. (Agent: If no, request that the patient contact the pharmacy for the refill.) Patient states she was going to do over the counter but since has changed her mind and wants a prescription (Agent: If yes, when and what did the pharmacy advise?) [facility] [id number]ann Sheen- [place], [place] - [address] [phone] (Phone) [phone] (Fax)   Preferred Pharmacy (with phone number or street name):   Agent: Please be advised that RX refills may take up to 3 business days. We ask that you follow-up with your pharmacy.

## 2018-04-16 NOTE — Telephone Encounter (Signed)
LOV on 03-17-2018 with Dr. Dayton MartesAron / Patient is now requesting a prescription for Vit. D / Per note, patient was going to get OTC, but is now requesting a prescription.

## 2018-04-19 ENCOUNTER — Telehealth: Payer: Self-pay | Admitting: *Deleted

## 2018-04-19 ENCOUNTER — Encounter: Payer: Self-pay | Admitting: Obstetrics & Gynecology

## 2018-04-19 LAB — CYTOLOGY - PAP
Diagnosis: UNDETERMINED — AB
HPV 16/18/45 genotyping: POSITIVE — AB
HPV: DETECTED — AB

## 2018-04-19 NOTE — Telephone Encounter (Signed)
-----   Message from Tereso NewcomerUgonna A Anyanwu, MD sent at 04/19/2018  3:40 PM EST ----- Diagnosis 1. Cervix, biopsy, ecto, 6 o'clock -BENIGN ENDOCERVICAL MUCOSA WITH INFLAMMATION AND EROSION. -NO ATYPIA, DYSPLASIA OR MALIGNANCY IDENTIFIED. -SEE COMMENT. 2. Endocervix, curettage -MUCOID MATERIAL CONTAINING SCANT BENIGN ENDOCERVICAL CELLS AND BENIGN SQUAMOUS EPITHELIUM. -NO DYSPLASIA OR MALIGNANCY IDENTIFIED. Microscopic Comment 1. The diagnosis is supported with p16 immunohistochemistry. (MEG)  This is benign pathology, need to repeat pap and HPV testing in one year.  Please call to inform patient of results and recommendations.

## 2018-04-20 ENCOUNTER — Telehealth: Payer: Self-pay | Admitting: *Deleted

## 2018-04-20 NOTE — Telephone Encounter (Signed)
-----   Message from [name] A Anyanwu, MD sent at 04/19/2018  3:40 PM EST ----- Diagnosis 1. Cervix, biopsy, ecto, 6 o'clock -BENIGN ENDOCERVICAL MUCOSA WITH INFLAMMATION AND EROSION. -NO ATYPIA, DYSPLASIA OR MALIGNANCY IDENTIFIED. -SEE COMMENT. 2. Endocervix, curettage -MUCOID MATERIAL CONTAINING SCANT BENIGN ENDOCERVICAL CELLS AND BENIGN SQUAMOUS EPITHELIUM. -NO DYSPLASIA OR MALIGNANCY IDENTIFIED. Microscopic Comment 1. The diagnosis is supported with p16 immunohistochemistry. (MEG)  This is benign pathology, need to repeat pap and HPV testing in one year.  Please call to inform patient of results and recommendations.     

## 2018-04-20 NOTE — Telephone Encounter (Signed)
Pt informed of results and to follow up in one year to repeat pap and HPV testing.

## 2018-04-22 ENCOUNTER — Other Ambulatory Visit: Payer: Self-pay

## 2018-04-22 MED ORDER — VITAMIN D (ERGOCALCIFEROL) 1.25 MG (50000 UNIT) PO CAPS: 50000.0000 [IU] | ORAL_CAPSULE | ORAL | 0 refills | Status: DC

## 2018-04-22 NOTE — Telephone Encounter (Signed)
Left vm for the pt to call back, just want to make sure that Vit D 50000?

## 2018-04-22 NOTE — Telephone Encounter (Signed)
Sent in Rx/thx dmf 

## 2018-06-08 ENCOUNTER — Ambulatory Visit: Payer: BLUE CROSS/BLUE SHIELD | Admitting: Psychology

## 2018-07-12 ENCOUNTER — Other Ambulatory Visit: Payer: Self-pay | Admitting: Family Medicine

## 2018-07-15 ENCOUNTER — Ambulatory Visit: Payer: BLUE CROSS/BLUE SHIELD | Admitting: Psychology

## 2018-07-29 ENCOUNTER — Ambulatory Visit: Payer: BLUE CROSS/BLUE SHIELD | Admitting: Psychology

## 2018-07-29 ENCOUNTER — Other Ambulatory Visit: Payer: Self-pay | Admitting: Family Medicine

## 2018-07-29 DIAGNOSIS — Z1231 Encounter for screening mammogram for malignant neoplasm of breast: Secondary | ICD-10-CM

## 2018-08-16 ENCOUNTER — Ambulatory Visit: Payer: BLUE CROSS/BLUE SHIELD | Admitting: Psychology

## 2018-09-16 ENCOUNTER — Ambulatory Visit: Payer: BLUE CROSS/BLUE SHIELD | Admitting: Psychology

## 2018-09-17 ENCOUNTER — Ambulatory Visit (INDEPENDENT_AMBULATORY_CARE_PROVIDER_SITE_OTHER): Payer: BLUE CROSS/BLUE SHIELD | Admitting: Psychology

## 2018-09-17 DIAGNOSIS — F411 Generalized anxiety disorder: Secondary | ICD-10-CM

## 2018-09-25 ENCOUNTER — Other Ambulatory Visit: Payer: Self-pay

## 2018-09-25 ENCOUNTER — Ambulatory Visit
Admission: RE | Admit: 2018-09-25 | Discharge: 2018-09-25 | Disposition: A | Discharge status: Home / Self Care | Payer: BLUE CROSS/BLUE SHIELD | Source: Ambulatory Visit | Attending: Family Medicine | Admitting: Family Medicine

## 2018-09-25 DIAGNOSIS — Z1231 Encounter for screening mammogram for malignant neoplasm of breast: Secondary | ICD-10-CM

## 2018-09-27 ENCOUNTER — Ambulatory Visit: Payer: Self-pay

## 2018-09-28 ENCOUNTER — Other Ambulatory Visit: Payer: Self-pay | Admitting: Family Medicine

## 2018-09-28 DIAGNOSIS — R928 Other abnormal and inconclusive findings on diagnostic imaging of breast: Secondary | ICD-10-CM

## 2018-10-04 ENCOUNTER — Other Ambulatory Visit: Payer: Self-pay | Admitting: Family Medicine

## 2018-10-04 ENCOUNTER — Ambulatory Visit (INDEPENDENT_AMBULATORY_CARE_PROVIDER_SITE_OTHER): Payer: BC Managed Care – PPO | Admitting: Psychology

## 2018-10-04 DIAGNOSIS — F411 Generalized anxiety disorder: Secondary | ICD-10-CM | POA: Diagnosis not present

## 2018-10-05 ENCOUNTER — Ambulatory Visit
Admission: RE | Admit: 2018-10-05 | Discharge: 2018-10-05 | Disposition: A | Discharge status: Home / Self Care | Payer: BC Managed Care – PPO | Source: Ambulatory Visit | Attending: Family Medicine | Admitting: Family Medicine

## 2018-10-05 ENCOUNTER — Other Ambulatory Visit: Payer: Self-pay

## 2018-10-05 DIAGNOSIS — R922 Inconclusive mammogram: Secondary | ICD-10-CM | POA: Diagnosis not present

## 2018-10-05 DIAGNOSIS — N6489 Other specified disorders of breast: Secondary | ICD-10-CM | POA: Diagnosis not present

## 2018-10-05 DIAGNOSIS — R928 Other abnormal and inconclusive findings on diagnostic imaging of breast: Secondary | ICD-10-CM

## 2018-10-05 DIAGNOSIS — N6311 Unspecified lump in the right breast, upper outer quadrant: Secondary | ICD-10-CM | POA: Diagnosis not present

## 2018-10-05 DIAGNOSIS — N6001 Solitary cyst of right breast: Secondary | ICD-10-CM | POA: Diagnosis not present

## 2018-10-19 ENCOUNTER — Ambulatory Visit: Payer: BC Managed Care – PPO | Admitting: Psychology

## 2018-10-31 ENCOUNTER — Other Ambulatory Visit: Payer: Self-pay | Admitting: Family Medicine

## 2018-11-08 ENCOUNTER — Ambulatory Visit: Payer: BLUE CROSS/BLUE SHIELD

## 2018-11-15 ENCOUNTER — Ambulatory Visit (INDEPENDENT_AMBULATORY_CARE_PROVIDER_SITE_OTHER): Payer: BC Managed Care – PPO | Admitting: Psychology

## 2018-11-15 DIAGNOSIS — F411 Generalized anxiety disorder: Secondary | ICD-10-CM

## 2018-11-30 ENCOUNTER — Ambulatory Visit: Payer: BC Managed Care – PPO | Admitting: Psychology

## 2018-12-12 ENCOUNTER — Other Ambulatory Visit: Payer: Self-pay | Admitting: Family Medicine

## 2018-12-21 ENCOUNTER — Ambulatory Visit (INDEPENDENT_AMBULATORY_CARE_PROVIDER_SITE_OTHER): Payer: BC Managed Care – PPO | Admitting: Psychology

## 2018-12-21 DIAGNOSIS — F411 Generalized anxiety disorder: Secondary | ICD-10-CM

## 2018-12-31 ENCOUNTER — Other Ambulatory Visit: Payer: Self-pay

## 2018-12-31 MED ORDER — VITAMIN D (ERGOCALCIFEROL) 1.25 MG (50000 UNIT) PO CAPS: ORAL_CAPSULE | ORAL | 0 refills | Status: DC

## 2019-01-04 ENCOUNTER — Other Ambulatory Visit: Payer: Self-pay | Admitting: Family Medicine

## 2019-01-11 ENCOUNTER — Ambulatory Visit (INDEPENDENT_AMBULATORY_CARE_PROVIDER_SITE_OTHER): Payer: BC Managed Care – PPO | Admitting: Psychology

## 2019-01-11 DIAGNOSIS — F411 Generalized anxiety disorder: Secondary | ICD-10-CM | POA: Diagnosis not present

## 2019-01-17 ENCOUNTER — Ambulatory Visit (INDEPENDENT_AMBULATORY_CARE_PROVIDER_SITE_OTHER): Payer: BC Managed Care – PPO | Admitting: Psychology

## 2019-01-17 DIAGNOSIS — F411 Generalized anxiety disorder: Secondary | ICD-10-CM

## 2019-01-19 ENCOUNTER — Other Ambulatory Visit: Payer: Self-pay

## 2019-01-19 MED ORDER — VITAMIN D (ERGOCALCIFEROL) 1.25 MG (50000 UNIT) PO CAPS: 50000.0000 [IU] | ORAL_CAPSULE | ORAL | 0 refills | Status: DC

## 2019-01-21 ENCOUNTER — Ambulatory Visit (INDEPENDENT_AMBULATORY_CARE_PROVIDER_SITE_OTHER): Payer: BC Managed Care – PPO

## 2019-01-21 DIAGNOSIS — Z23 Encounter for immunization: Secondary | ICD-10-CM

## 2019-01-25 ENCOUNTER — Ambulatory Visit (INDEPENDENT_AMBULATORY_CARE_PROVIDER_SITE_OTHER): Payer: BC Managed Care – PPO | Admitting: Psychology

## 2019-01-25 DIAGNOSIS — F411 Generalized anxiety disorder: Secondary | ICD-10-CM | POA: Diagnosis not present

## 2019-02-09 ENCOUNTER — Ambulatory Visit (INDEPENDENT_AMBULATORY_CARE_PROVIDER_SITE_OTHER): Payer: BC Managed Care – PPO | Admitting: Psychology

## 2019-02-09 DIAGNOSIS — F411 Generalized anxiety disorder: Secondary | ICD-10-CM | POA: Diagnosis not present

## 2019-02-22 ENCOUNTER — Ambulatory Visit: Payer: BC Managed Care – PPO | Admitting: Psychology

## 2019-03-15 ENCOUNTER — Ambulatory Visit: Payer: BC Managed Care – PPO | Admitting: Psychology

## 2019-03-22 NOTE — Progress Notes (Signed)
Subjective:    Patient ID: Sabrina Randall, female    DOB: May 30, 1975, 43 y.o.   MRN: [id number]  Chief Complaint  Patient presents with  . Annual Exam    Body aches x 3month  Pt c/o numbness in both hands, x2 weeks.  Patient will recieve Tdap today, and she is not fasting for lab work today.    HPI Patient is in today for CPX with pap and follow up.   Health Maintenance  Topic Date Due  . PAP SMEAR-Modifier  04/15/2021  . TETANUS/TDAP  03/27/2029  . INFLUENZA VACCINE  Completed  . HIV Screening  Completed   Patient is in today for CPE. Patient is due for a Tdap Vaccine. Flu is UTD.    Last PAP 12.26.19 and was referred to GYN for Colp/o for ASCUS with positive high risk HPV.   Saw Dr. AHarolyn Rutherfordon 04/15/18 for colposcopy.  Note reviewed-   Path showed 1. Cervix, biopsy, ecto, 6 o'clock  -BENIGN ENDOCERVICAL MUCOSA WITH INFLAMMATION AND EROSION.  -NO ATYPIA, DYSPLASIA OR MALIGNANCY IDENTIFIED.  -SEE COMMENT.  2. Endocervix, curettage  -MUCOID MATERIAL CONTAINING SCANT BENIGN ENDOCERVICAL CELLS AND BENIGN SQUAMOUS EPITHELIUM.  -NO DYSPLASIA OR MALIGNANCY IDENTIFIED.  Microscopic Comment  1. The diagnosis is supported with p16 immunohistochemistry. (MEG)   This is benign pathology, need to repeat pap and HPV testing in one year. Please call to inform patient of results and recommendations.   [facility].  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 Also her cholesterol had risen as well.  She is going through a separation but it was her choice and she feels she is coping well.  Seeing AMarya Fossawhich she feels has also been very helpful.     Dep[facility] 03/28/2019 03/17/2018 03/17/2018 01/21/2017  Decreased Interest 0 0 0 0  Down, Depressed, Hopeless 0 1 1 0  PHQ - 2 Score 0 1 1 0  Altered sleeping 1 0 - -  Tired, decreased energy 0 0  - -  Change in appetite 1 0 - -  Feeling bad or failure about yourself  0 0 - -  Trouble concentrating 0 0 - -  Moving slowly or fidgety/restless 0 0 - -  Suicidal thoughts - 0 - -  PHQ-9 Score 2 1 - -  Difficult doing work/chores Not difficult at all - - -   GAD 7 : Generalized Anxiety Score 03/17/2018  Nervous, Anxious, on Edge 0  Control/stop worrying 2  Worry too much - different things 2  Trouble relaxing 0  Restless 0  Easily annoyed or irritable 0  Afraid - awful might happen 0  Total GAD 7 Score 4   Joints in her hands hurt at night and sometimes in the morning- 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  History reviewed. No pertinent past medical history.  History reviewed. No pertinent surgical history.  History reviewed. No pertinent family history.    Outpatient Medications Prior to Visit  Medication Sig Dispense Refill  . ibuprofen (ADVIL) 200 MG tablet Take 200 mg by mouth every 6 (six) hours as needed.    . Melatonin 10 MG TABS Take by mouth.    . Vitamin Apoorva Bugay, Ergocalciferol, (DRISDOL) 1.25 MG (50000 UT) CAPS capsule Take 1 capsule (50,000 Units total) by mouth every 7 (seven) days. 12 capsule 0  No facility-administered medications prior to visit.     No Known Allergies  Review of Systems  Constitutional: Positive for malaise/fatigue. Negative for fever.  HENT: Negative.  Negative for congestion and hearing loss.   Eyes: Negative.  Negative for blurred vision, discharge and redness.  Respiratory: Negative.  Negative for cough and shortness of breath.   Cardiovascular: Negative for chest pain, palpitations and leg swelling.  Gastrointestinal: Negative.  Negative for abdominal pain and heartburn.  Genitourinary: Negative.  Negative for dysuria.  Musculoskeletal: Positive for joint pain. Negative for falls.  Skin: Negative.  Negative for rash.  Neurological: Negative.  Negative for loss of consciousness and headaches.  Endo/Heme/Allergies: Negative.  Does not  bruise/bleed easily.  Psychiatric/Behavioral: Negative.  Negative for depression.  All other systems reviewed and are negative.      Objective:    Physical Exam Vitals signs and nursing note reviewed.  Constitutional:      Appearance: Normal appearance. She is not ill-appearing.  HENT:     Head: Normocephalic and atraumatic.     Right Ear: External ear normal.     Left Ear: External ear normal.     Nose: Nose normal.  Eyes:     General:        Right eye: No discharge.        Left eye: No discharge.  Cardiovascular:     Rate and Rhythm: Normal rate and regular rhythm.     Heart sounds: Normal heart sounds.  Pulmonary:     Effort: Pulmonary effort is normal.     Breath sounds: No wheezing.  Abdominal:     Palpations: Abdomen is soft. There is no mass.     Tenderness: There is no abdominal tenderness. There is no guarding.  Musculoskeletal: Normal range of motion.     Right lower leg: No edema.     Left lower leg: No edema.  Skin:    General: Skin is dry.  Neurological:     Mental Status: She is alert and oriented to person, place, and time.     Deep Tendon Reflexes: Reflexes normal.  Psychiatric:        Mood and Affect: Mood normal.        Behavior: Behavior normal.     BP 90/60 (BP Location: Left Arm, Patient Position: Sitting, Cuff Size: Normal)   Pulse 86   Temp (!) 96.6 F (35.9 C) (Temporal)   Ht 5' 0.25" (1.53 m)   Wt 116 lb (52.6 kg)   LMP 02/24/2019   SpO2 98%   BMI 22.47 kg/m  Wt Readings from Last 3 Encounters:  03/28/19 116 lb (52.6 kg)  04/15/18 122 lb (55.3 kg)  03/17/18 121 lb (54.9 kg)     Lab Results  Component Value Date   WBC 4.4 03/17/2018   HGB 13.6 03/17/2018   HCT 42.5 03/17/2018   PLT 308.0 03/17/2018   GLUCOSE 85 03/17/2018   CHOL 243 (H) 03/17/2018   TRIG 178.0 (H) 03/17/2018   HDL 66.70 03/17/2018   LDLDIRECT 133.5 04/24/2011   LDLCALC 141 (H) 03/17/2018   ALT 18 03/17/2018   AST 20 03/17/2018   NA 138 03/17/2018   K  4.9 03/17/2018   CL 102 03/17/2018   CREATININE 0.66 03/17/2018   BUN 11 03/17/2018   CO2 29 03/17/2018   TSH 1.02 03/17/2018    Lab Results  Component Value Date   TSH 1.02 03/17/2018   Lab Results  Component Value Date  WBC 4.4 03/17/2018   HGB 13.6 03/17/2018   HCT 42.5 03/17/2018   MCV 84.2 03/17/2018   PLT 308.0 03/17/2018   Lab Results  Component Value Date   NA 138 03/17/2018   K 4.9 03/17/2018   CO2 29 03/17/2018   GLUCOSE 85 03/17/2018   BUN 11 03/17/2018   CREATININE 0.66 03/17/2018   BILITOT 0.9 03/17/2018   ALKPHOS 87 03/17/2018   AST 20 03/17/2018   ALT 18 03/17/2018   PROT 7.4 03/17/2018   ALBUMIN 4.4 03/17/2018   CALCIUM 9.7 03/17/2018   GFR 103.74 03/17/2018   Lab Results  Component Value Date   CHOL 243 (H) 03/17/2018   Lab Results  Component Value Date   HDL 66.70 03/17/2018   Lab Results  Component Value Date   LDLCALC 141 (H) 03/17/2018   Lab Results  Component Value Date   TRIG 178.0 (H) 03/17/2018   Lab Results  Component Value Date   CHOLHDL 4 03/17/2018   No results found for: HGBA1C     Assessment & Plan:   Problem List Items Addressed This Visit      Active Problems   Anemia, iron deficiency    Lab Results  Component Value Date   WBC 4.4 03/17/2018   HGB 13.6 03/17/2018   HCT 42.5 03/17/2018   MCV 84.2 03/17/2018   PLT 308.0 03/17/2018   Lab Results  Component Value Date   FERRITIN 5.7 (L) 03/17/2018  . Repeat labs today.      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  Orders Placed This Encounter  Procedures  . Comp Met (CMET)  . CBC w/Diff  . Lipid Profile  . Iron, TIBC  and Ferritin Panel  . Vitamin Berdine Rasmusson (25 hydroxy)     The 10-year ASCVD risk score Mikey Bussing DC Jr., et al., 2013) is: 0.4%   Values used to calculate the score:     Age: 40 years     Sex: Female     Is Non-Hispanic African American: No     Diabetic: No     Tobacco smoker: No     Systolic Blood Pressure: 91 mmHg     Is BP treated: No     HDL Cholesterol: 66.7 mg/dL     Total Cholesterol: 243 mg/dL       Relevant Orders   Comp Met (CMET)   Lipid Profile   Vitamin Aurielle Slingerland deficiency    Repeat Vitamin Shakeeta Godette. today      Relevant Orders   Vitamin Mika Griffitts (25 hydroxy)   Well woman exam with routine gynecological exam - Primary    Reviewed preventive care protocols, scheduled due services, and updated immunizations Discussed nutrition, exercise, diet, and healthy lifestyle.  Due for follow up and HPV testing based on her colposcopy results per GYN.      Relevant Orders   Comp Met (CMET)   CBC w/Diff    Other Visit Diagnoses    Need for prophylactic vaccination with combined diphtheria-tetanus-pertussis (DTP) vaccine          I am having Jana Half  Rosaria Ferries maintain her Vitamin Nakshatra Klose (Ergocalciferol), Melatonin, and ibuprofen.  No orders of the defined types were placed in this encounter.   Arnette Norris, MD   This visit occurred during the SARS-CoV-2 public health emergency.  Safety protocols were in place, including screening questions prior to the visit, additional usage of staff PPE, and extensive cleaning of exam room while observing appropriate contact time as indicated for disinfecting solutions.

## 2019-03-24 DIAGNOSIS — E559 Vitamin D deficiency, unspecified: Secondary | ICD-10-CM | POA: Insufficient documentation

## 2019-03-24 DIAGNOSIS — Z01419 Encounter for gynecological examination (general) (routine) without abnormal findings: Secondary | ICD-10-CM | POA: Insufficient documentation

## 2019-03-24 NOTE — Assessment & Plan Note (Signed)
Reviewed preventive care protocols, scheduled due services, and updated immunizations Discussed nutrition, exercise, diet, and healthy lifestyle.  Due for follow up and HPV testing based on her colposcopy results per GYN.

## 2019-03-24 NOTE — Assessment & Plan Note (Signed)
Lab Results  Component Value Date   WBC 4.4 03/17/2018   HGB 13.6 03/17/2018   HCT 42.5 03/17/2018   MCV 84.2 03/17/2018   PLT 308.0 03/17/2018   Lab Results  Component Value Date   FERRITIN 5.7 (L) 03/17/2018  . Repeat labs today.

## 2019-03-24 NOTE — Assessment & Plan Note (Signed)
Repeat Vitamin D today. 

## 2019-03-24 NOTE — Assessment & Plan Note (Addendum)
Lab Results  Component Value Date   CHOL 243 (H) 03/17/2018   HDL 66.70 03/17/2018   LDLCALC 141 (H) 03/17/2018   LDLDIRECT 133.5 04/24/2011   TRIG 178.0 (H) 03/17/2018   CHOLHDL 4 03/17/2018   Repeat labs today.  Orders Placed This Encounter  Procedures  . Comp Met (CMET)  . CBC w/Diff  . Lipid Profile  . Iron, TIBC and Ferritin Panel  . Vitamin D (25 hydroxy)     The 10-year ASCVD risk score Mikey Bussing DC Jr., et al., 2013) is: 0.4%   Values used to calculate the score:     Age: 43 years     Sex: Female     Is Non-Hispanic African American: No     Diabetic: No     Tobacco smoker: No     Systolic Blood Pressure: 91 mmHg     Is BP treated: No     HDL Cholesterol: 66.7 mg/dL     Total Cholesterol: 243 mg/dL

## 2019-03-28 ENCOUNTER — Encounter: Payer: Self-pay | Admitting: Family Medicine

## 2019-03-28 ENCOUNTER — Ambulatory Visit (INDEPENDENT_AMBULATORY_CARE_PROVIDER_SITE_OTHER): Payer: BC Managed Care – PPO | Admitting: Family Medicine

## 2019-03-28 ENCOUNTER — Other Ambulatory Visit (HOSPITAL_COMMUNITY)
Admission: RE | Admit: 2019-03-28 | Discharge: 2019-03-28 | Disposition: A | Discharge status: Home / Self Care | Payer: BC Managed Care – PPO | Source: Ambulatory Visit | Attending: Family Medicine | Admitting: Family Medicine

## 2019-03-28 ENCOUNTER — Other Ambulatory Visit: Payer: Self-pay

## 2019-03-28 VITALS — BP 90/60 | HR 86 | Temp 96.6°F | Ht 60.25 in | Wt 116.0 lb

## 2019-03-28 DIAGNOSIS — E785 Hyperlipidemia, unspecified: Secondary | ICD-10-CM

## 2019-03-28 DIAGNOSIS — D508 Other iron deficiency anemias: Secondary | ICD-10-CM

## 2019-03-28 DIAGNOSIS — E559 Vitamin D deficiency, unspecified: Secondary | ICD-10-CM | POA: Diagnosis not present

## 2019-03-28 DIAGNOSIS — R87610 Atypical squamous cells of undetermined significance on cytologic smear of cervix (ASC-US): Secondary | ICD-10-CM

## 2019-03-28 DIAGNOSIS — M25549 Pain in joints of unspecified hand: Secondary | ICD-10-CM

## 2019-03-28 DIAGNOSIS — R87810 Cervical high risk human papillomavirus (HPV) DNA test positive: Secondary | ICD-10-CM

## 2019-03-28 DIAGNOSIS — M2550 Pain in unspecified joint: Secondary | ICD-10-CM | POA: Insufficient documentation

## 2019-03-28 DIAGNOSIS — Z01419 Encounter for gynecological examination (general) (routine) without abnormal findings: Secondary | ICD-10-CM | POA: Diagnosis not present

## 2019-03-28 DIAGNOSIS — Z0000 Encounter for general adult medical examination without abnormal findings: Secondary | ICD-10-CM | POA: Diagnosis not present

## 2019-03-28 DIAGNOSIS — R87612 Low grade squamous intraepithelial lesion on cytologic smear of cervix (LGSIL): Secondary | ICD-10-CM | POA: Diagnosis not present

## 2019-03-28 DIAGNOSIS — Z23 Encounter for immunization: Secondary | ICD-10-CM

## 2019-03-28 LAB — LIPID PANEL
Cholesterol: 198 mg/dL (ref 0–200)
HDL: 64.2 mg/dL (ref >39.00)
LDL Cholesterol: 118 mg/dL — ABNORMAL HIGH (ref 0–99)
NonHDL: 133.91
Total CHOL/HDL Ratio: 3
Triglycerides: 80 mg/dL (ref 0.0–149.0)
VLDL: 16 mg/dL (ref 0.0–40.0)

## 2019-03-28 LAB — CBC WITH DIFFERENTIAL/PLATELET
Basophils Absolute: 0 10*3/uL (ref 0.0–0.1)
Basophils Relative: 0.5 % (ref 0.0–3.0)
Eosinophils Absolute: 0.1 10*3/uL (ref 0.0–0.7)
Eosinophils Relative: 2.2 % (ref 0.0–5.0)
HCT: 43 % (ref 36.0–46.0)
Hemoglobin: 14.2 g/dL (ref 12.0–15.0)
Lymphocytes Relative: 36.7 % (ref 12.0–46.0)
Lymphs Abs: 1.7 10*3/uL (ref 0.7–4.0)
MCHC: 32.9 g/dL (ref 30.0–36.0)
MCV: 94 fl (ref 78.0–100.0)
Monocytes Absolute: 0.4 10*3/uL (ref 0.1–1.0)
Monocytes Relative: 8.9 % (ref 3.0–12.0)
Neutro Abs: 2.3 10*3/uL (ref 1.4–7.7)
Neutrophils Relative %: 51.7 % (ref 43.0–77.0)
Platelets: 274 10*3/uL (ref 150.0–400.0)
RBC: 4.57 Mil/uL (ref 3.87–5.11)
RDW: 14.4 % (ref 11.5–15.5)
WBC: 4.5 10*3/uL (ref 4.0–10.5)

## 2019-03-28 LAB — COMPREHENSIVE METABOLIC PANEL
ALT: 16 U/L (ref 0–35)
AST: 24 U/L (ref 0–37)
Albumin: 4.4 g/dL (ref 3.5–5.2)
Alkaline Phosphatase: 115 U/L (ref 39–117)
BUN: 18 mg/dL (ref 6–23)
CO2: 29 mEq/L (ref 19–32)
Calcium: 9.4 mg/dL (ref 8.4–10.5)
Chloride: 106 mEq/L (ref 96–112)
Creatinine, Ser: 0.59 mg/dL (ref 0.40–1.20)
GFR: 110.56 mL/min (ref >60.00)
Glucose, Bld: 56 mg/dL — ABNORMAL LOW (ref 70–99)
Potassium: 4 mEq/L (ref 3.5–5.1)
Sodium: 143 mEq/L (ref 135–145)
Total Bilirubin: 0.8 mg/dL (ref 0.2–1.2)
Total Protein: 6.8 g/dL (ref 6.0–8.3)

## 2019-03-28 LAB — VITAMIN D 25 HYDROXY (VIT D DEFICIENCY, FRACTURES): VITD: 51.79 ng/mL (ref 30.00–100.00)

## 2019-03-28 LAB — SEDIMENTATION RATE: Sed Rate: 17 mm/hr (ref 0–20)

## 2019-03-28 MED ORDER — TETANUS-DIPHTH-ACELL PERTUSSIS 5-2.5-18.5 LF-MCG/0.5 IM SUSP
0.5000 mL | Freq: Once | INTRAMUSCULAR | Status: AC
  Administered 2019-03-28: 0.5 mL via INTRAMUSCULAR

## 2019-03-28 NOTE — Assessment & Plan Note (Signed)
No abnormality on exam- likely some arthritis and tendonitis from moving boxes. Will check additional labs today. The patient indicates understanding of these issues and agrees with the plan. Orders Placed This Encounter  Procedures  . Comp Met (CMET)  . CBC w/Diff  . Lipid Profile  . Iron, TIBC and Ferritin Panel  . Vitamin D (25 hydroxy)  . Sedimentation rate  . Antinuclear Antib (ANA)  . Cyclic citrul peptide antibody, IgG ([facility])  . Rheumatoid Factor

## 2019-03-28 NOTE — Patient Instructions (Signed)
Great to see you. I will call you with your lab results from today and you can view them online.   

## 2019-03-30 LAB — IRON,TIBC AND FERRITIN PANEL
%SAT: 15 % (calc) — ABNORMAL LOW (ref 16–45)
Ferritin: 10 ng/mL — ABNORMAL LOW (ref 16–232)
Iron: 67 ug/dL (ref 40–190)
TIBC: 453 mcg/dL (calc) — ABNORMAL HIGH (ref 250–450)

## 2019-03-30 LAB — RHEUMATOID FACTOR: Rheumatoid fact SerPl-aCnc: <14 IU/mL (ref <14)

## 2019-03-30 LAB — CYTOLOGY - PAP
Comment: NEGATIVE
High risk HPV: NEGATIVE

## 2019-03-30 LAB — ANTI-NUCLEAR AB-TITER (ANA TITER)
ANA TITER: 1:40 {titer} — ABNORMAL HIGH
ANA Titer 1: 1:40 {titer} — ABNORMAL HIGH

## 2019-03-30 LAB — ANA: Anti Nuclear Antibody (ANA): POSITIVE — AB

## 2019-03-30 LAB — CYCLIC CITRUL PEPTIDE ANTIBODY, IGG: Cyclic Citrullin Peptide Ab: <16 UNITS

## 2019-03-31 ENCOUNTER — Other Ambulatory Visit: Payer: Self-pay | Admitting: Family Medicine

## 2019-03-31 DIAGNOSIS — R87612 Low grade squamous intraepithelial lesion on cytologic smear of cervix (LGSIL): Secondary | ICD-10-CM

## 2019-03-31 DIAGNOSIS — R768 Other specified abnormal immunological findings in serum: Secondary | ICD-10-CM

## 2019-04-04 DIAGNOSIS — R768 Other specified abnormal immunological findings in serum: Secondary | ICD-10-CM | POA: Diagnosis not present

## 2019-04-04 DIAGNOSIS — G5603 Carpal tunnel syndrome, bilateral upper limbs: Secondary | ICD-10-CM | POA: Diagnosis not present

## 2019-04-04 DIAGNOSIS — M2550 Pain in unspecified joint: Secondary | ICD-10-CM | POA: Diagnosis not present

## 2019-04-06 ENCOUNTER — Ambulatory Visit: Payer: BC Managed Care – PPO | Admitting: Psychology

## 2019-04-28 ENCOUNTER — Ambulatory Visit: Payer: BC Managed Care – PPO | Admitting: Psychology

## 2019-04-30 DIAGNOSIS — Z20828 Contact with and (suspected) exposure to other viral communicable diseases: Secondary | ICD-10-CM | POA: Diagnosis not present

## 2019-05-02 ENCOUNTER — Ambulatory Visit (INDEPENDENT_AMBULATORY_CARE_PROVIDER_SITE_OTHER): Payer: BC Managed Care – PPO | Admitting: Psychology

## 2019-05-02 ENCOUNTER — Ambulatory Visit: Payer: Self-pay | Admitting: Obstetrics & Gynecology

## 2019-05-02 DIAGNOSIS — F411 Generalized anxiety disorder: Secondary | ICD-10-CM

## 2019-05-15 DIAGNOSIS — U071 COVID-19: Secondary | ICD-10-CM | POA: Diagnosis not present

## 2019-05-25 ENCOUNTER — Ambulatory Visit: Payer: BC Managed Care – PPO | Admitting: Psychology

## 2019-05-27 ENCOUNTER — Telehealth: Payer: Self-pay | Admitting: Family Medicine

## 2019-05-27 NOTE — Telephone Encounter (Signed)
TA-Plz review December lab results/pt is wanting the results/plz advise/thx dmf

## 2019-05-27 NOTE — Telephone Encounter (Signed)
Patient is calling back for results. CB is [phone]

## 2019-05-29 NOTE — Telephone Encounter (Signed)
I did review them and referred her to GYN and rheum- we discussed them.  Are those the labs she is talking about?

## 2019-05-30 NOTE — Telephone Encounter (Signed)
LMOVM asking if December labs that PCP discussed with pt and referred her to 2 places is what she is talking about/thx dmf

## 2019-06-03 ENCOUNTER — Ambulatory Visit (INDEPENDENT_AMBULATORY_CARE_PROVIDER_SITE_OTHER): Payer: BC Managed Care – PPO | Admitting: Psychology

## 2019-06-03 DIAGNOSIS — F411 Generalized anxiety disorder: Secondary | ICD-10-CM | POA: Diagnosis not present

## 2019-06-16 ENCOUNTER — Ambulatory Visit (INDEPENDENT_AMBULATORY_CARE_PROVIDER_SITE_OTHER): Payer: BC Managed Care – PPO | Admitting: Psychology

## 2019-06-16 DIAGNOSIS — F411 Generalized anxiety disorder: Secondary | ICD-10-CM | POA: Diagnosis not present

## 2019-06-22 ENCOUNTER — Other Ambulatory Visit: Payer: Self-pay

## 2019-06-30 ENCOUNTER — Ambulatory Visit: Payer: BC Managed Care – PPO | Admitting: Psychology

## 2019-07-25 ENCOUNTER — Other Ambulatory Visit: Payer: Self-pay

## 2019-07-25 ENCOUNTER — Encounter: Payer: Self-pay | Admitting: Obstetrics & Gynecology

## 2019-07-25 ENCOUNTER — Ambulatory Visit: Payer: BC Managed Care – PPO | Admitting: Obstetrics & Gynecology

## 2019-07-25 VITALS — BP 118/78 | Ht 60.0 in | Wt 111.0 lb

## 2019-07-25 DIAGNOSIS — N870 Mild cervical dysplasia: Secondary | ICD-10-CM

## 2019-07-25 DIAGNOSIS — R87612 Low grade squamous intraepithelial lesion on cytologic smear of cervix (LGSIL): Secondary | ICD-10-CM

## 2019-07-25 DIAGNOSIS — R87810 Cervical high risk human papillomavirus (HPV) DNA test positive: Secondary | ICD-10-CM

## 2019-07-25 NOTE — Progress Notes (Signed)
    Sabrina Randall Sabrina Randall 1975/05/13 [id number]        44 y.o.  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  Pap 03/2019 LGSIL.  Mild-moderate dysmenorrhea, normal menses every month otherwise.  No pelvic pain.  No pain with IC.  C/O mild bulge in the anterior vagina when passing urine.   OB History  Gravida Para Term Preterm AB Living  2 2 1 1   2   SAB TAB Ectopic Multiple Live Births               # Outcome Date GA Lbr Len/2nd Weight Sex Delivery Anes PTL Lv  2 Preterm           1 Term             Past medical history,surgical history, problem list, medications, allergies, family history and social history were all reviewed and documented in the EPIC chart.   Directed ROS with pertinent positives and negatives documented in the history of present illness/assessment and plan.  Exam:  Vitals:   07/25/19 1215  BP: 118/78  Weight: 111 lb (50.3 kg)  Height: 5' (1.524 m)   General appearance:  Normal  Colposcopy Procedure Note [name] 07/25/2019  Indications:  LGSIL 03/2019 and HPV 16 positive 03/2018  Procedure Details  The risks and benefits of the procedure and Verbal informed consent obtained.  Speculum placed in vagina and excellent visualization of cervix achieved, cervix swabbed x 3 with acetic acid solution.  Findings:  Cervix colposcopy: Physical Exam Genitourinary:       Vaginal colposcopy: Normal  Vulvar colposcopy: Normal  Perirectal colposcopy: Normal  The cervix was sprayed with Hurricane before performing the cervical biopsies.  Specimens:  HPV 16-18-45.  Cervical Bxs 5-7-9 and 12 O'Clock.  Complications:  None, hemostasis with Silver Nitrate . Plan: Management per Bx results  Gyn exam:  Vulva normal.  Bimanual exam Uterus AV, normal volume, NT, mobile.  No adnexal mass, NT.  Valsalva, minimal Cystocele <1/4.   Assessment/Plan:  44  y.o. G2P1102   1. 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  Colposcopy negative in April 2020.  Recurrence of LGSIL in December 2020.  Colposcopy procedure reviewed.  Colposcopy findings discussed with patient.  Management per pathology results and HPV 16-18-45 results. - Pathology Report ([facility]) - HPV Type 16 and 18/45 RNA  2. Cervical high risk human papillomavirus (HPV) DNA test positive - Pathology Report ([facility]) - HPV Type 16 and 18/45 RNA  Other orders - Pathology Report - TISSUE PATH REPORT  01-12-1993 MD, 12:25 PM 07/25/2019

## 2019-07-26 DIAGNOSIS — N870 Mild cervical dysplasia: Secondary | ICD-10-CM | POA: Diagnosis not present

## 2019-07-26 DIAGNOSIS — R87810 Cervical high risk human papillomavirus (HPV) DNA test positive: Secondary | ICD-10-CM | POA: Diagnosis not present

## 2019-07-26 DIAGNOSIS — R87612 Low grade squamous intraepithelial lesion on cytologic smear of cervix (LGSIL): Secondary | ICD-10-CM | POA: Diagnosis not present

## 2019-07-27 LAB — HPV TYPE 16 AND 18/45 RNA
HPV Type 16 RNA: NOT DETECTED
HPV Type 18/45 RNA: NOT DETECTED

## 2019-07-28 LAB — TISSUE PATH REPORT

## 2019-07-28 LAB — PATHOLOGY REPORT

## 2019-08-01 ENCOUNTER — Encounter: Payer: Self-pay | Admitting: Obstetrics & Gynecology

## 2019-08-01 NOTE — Patient Instructions (Signed)
1. 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  Colposcopy negative in April 2020.  Recurrence of LGSIL in December 2020.  Colposcopy procedure reviewed.  Colposcopy findings discussed with patient.  Management per pathology results and HPV 16-18-45 results. - Pathology Report ([facility]) - HPV Type 16 and 18/45 RNA  2. Cervical high risk human papillomavirus (HPV) DNA test positive - Pathology Report ([facility]) - HPV Type 16 and 18/45 RNA  Other orders - Pathology Report - TISSUE PATH REPORT  Sabrina Randall, it was a pleasure seeing you today!  I will inform you of your results as soon as they are available.

## 2019-08-03 ENCOUNTER — Telehealth: Payer: Self-pay

## 2019-08-03 NOTE — Telephone Encounter (Signed)
I was talking with patient today about her biopsy result from Colpo.  In the course of that conversation she mentioned she was diagnosed with HSV years ago and has some warning signs (itching and soreness in that area) it is coming and then a painful blister. She has outbreaks infrequently. 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  She said years ago when she was diagnosed they gave her medicine but she took it that time and never had it again. She is definitely interested in getting a prescription if you will prescribe it for her for use with outbreak. Rx?

## 2019-08-03 NOTE — Telephone Encounter (Signed)
Yes, agree with Valacyclovir 1g daily x 5 days.  #15, refill x 2.

## 2019-08-04 MED ORDER — VALACYCLOVIR HCL 1 G PO TABS: ORAL_TABLET | ORAL | 2 refills | Status: DC

## 2019-08-04 NOTE — Telephone Encounter (Signed)
Rx sent. Patient instructed to take at first sign that outbreak might be coming on.

## 2019-08-23 ENCOUNTER — Ambulatory Visit (INDEPENDENT_AMBULATORY_CARE_PROVIDER_SITE_OTHER): Payer: BC Managed Care – PPO | Admitting: Psychology

## 2019-08-23 DIAGNOSIS — F411 Generalized anxiety disorder: Secondary | ICD-10-CM | POA: Diagnosis not present

## 2019-09-06 ENCOUNTER — Other Ambulatory Visit: Payer: Self-pay | Admitting: Family Medicine

## 2019-09-06 DIAGNOSIS — Z1231 Encounter for screening mammogram for malignant neoplasm of breast: Secondary | ICD-10-CM

## 2019-09-09 ENCOUNTER — Ambulatory Visit: Payer: BC Managed Care – PPO | Admitting: Psychology

## 2019-10-04 ENCOUNTER — Other Ambulatory Visit: Payer: Self-pay

## 2019-10-04 ENCOUNTER — Encounter: Payer: Self-pay | Admitting: Family Medicine

## 2019-10-04 ENCOUNTER — Ambulatory Visit: Payer: BC Managed Care – PPO | Admitting: Family Medicine

## 2019-10-04 VITALS — BP 102/60 | HR 77 | Ht 59.5 in | Wt 113.0 lb

## 2019-10-04 DIAGNOSIS — A6009 Herpesviral infection of other urogenital tract: Secondary | ICD-10-CM | POA: Diagnosis not present

## 2019-10-04 DIAGNOSIS — M79642 Pain in left hand: Secondary | ICD-10-CM | POA: Diagnosis not present

## 2019-10-04 DIAGNOSIS — N870 Mild cervical dysplasia: Secondary | ICD-10-CM | POA: Insufficient documentation

## 2019-10-04 DIAGNOSIS — R790 Abnormal level of blood mineral: Secondary | ICD-10-CM | POA: Diagnosis not present

## 2019-10-04 DIAGNOSIS — M79641 Pain in right hand: Secondary | ICD-10-CM | POA: Diagnosis not present

## 2019-10-04 NOTE — Assessment & Plan Note (Signed)
ANA positive in 03/2019 and saw specialist (suspect Rheumatologist) and got additional work-up which was reassuring per patient. Unable to see records in chart so will request. Based on her treatment (brace) it seems they suspected carpal tunnel. Given no improvement in numbness/finger symptoms and lack of evidence on exam for carpal tunnel not sure what her diagnosis is. Will get outside records and based on previous assessment and work-up will consider neurology referral vs returning to rheumatologist.

## 2019-10-04 NOTE — Assessment & Plan Note (Signed)
Noted on labs in Dec 2020. Offered repeat - pt primarily supplementing protein. No symptoms of fatigue. She will wait to repeat in Dec. Discussed diet and supplements. Advised daily vitamin not necessary given varied diet but could continue.

## 2019-10-04 NOTE — Progress Notes (Signed)
Subjective:     Sabrina Randall is a 44 y.o. female presenting for Numbness (bilateral hands) and Establish Care     HPI   #Hand Numbness - 2nd shift  - saw a rheumatologist last year - nighttime fingers will cramp up when she is sleeping - sleeps with hands above her head - told to wear a brace and had an ultrasound done which he showed that there inflammation in the wrist - using brace at night - feels this is uncomfortable - numbness is all the fingers of both hands R>L - endorses cramping and numbness which has not improved - has been doing more lifting at her job over the last 6 months - not using the brace often - when she did use this for 2 full weeks was still getting the numbness in the fingers at night  #diet Eats every 2 hours Works second shift and tries to get protein Boiled eggs, cheese 9am to 12 pm - will eat lunch - veggies/fruit Before work at 3 pm will eat dinner  Around 6 pm will do protein shake and snack Drinks a lot of water Slice a bread and milk when she comes home at 10 pm  Tart cherry juice for sleep   Review of Systems   Social History   Tobacco Use  Smoking Status Former Smoker  . Quit date: 27  . Years since quitting: 23.4  Smokeless Tobacco Never Used        Objective:    BP Readings from Last 3 Encounters:  10/04/19 102/60  07/25/19 118/78  03/28/19 90/60   Wt Readings from Last 3 Encounters:  10/04/19 113 lb (51.3 kg)  07/25/19 111 lb (50.3 kg)  03/28/19 116 lb (52.6 kg)    BP 102/60   Pulse 77   Ht 4' 11.5" (1.511 m)   Wt 113 lb (51.3 kg)   SpO2 98%   BMI 22.44 kg/m    Physical Exam Constitutional:      General: She is not in acute distress.    Appearance: She is well-developed. She is not diaphoretic.  HENT:     Right Ear: External ear normal.     Left Ear: External ear normal.  Eyes:     Conjunctiva/sclera: Conjunctivae normal.  Cardiovascular:     Rate and Rhythm: Normal rate.    Pulmonary:     Effort: Pulmonary effort is normal.  Musculoskeletal:     Cervical back: Neck supple.     Comments: Hands - bilateral Inspection: no swelling, erythema Palpation: no ttp ROM: normal wrist, finger ROM Strength: normal wrist, finger Neg Tinel, neg phalen    Skin:    General: Skin is warm and dry.     Capillary Refill: Capillary refill takes less than 2 seconds.  Neurological:     Mental Status: She is alert. Mental status is at baseline.  Psychiatric:        Mood and Affect: Mood normal.        Behavior: Behavior normal.           Assessment & Plan:   Problem List Items Addressed This Visit      Genitourinary   Herpes genitalis in women    Valacyclovir for flare ups which is effective.         Other   Bilateral hand pain - Primary    ANA positive in 03/2019 and saw specialist (suspect Rheumatologist) and got additional work-up which was reassuring per patient.  Unable to see records in chart so will request. Based on her treatment (brace) it seems they suspected carpal tunnel. Given no improvement in numbness/finger symptoms and lack of evidence on exam for carpal tunnel not sure what her diagnosis is. Will get outside records and based on previous assessment and work-up will consider neurology referral vs returning to rheumatologist.       Low ferritin    Noted on labs in Dec 2020. Offered repeat - pt primarily supplementing protein. No symptoms of fatigue. She will wait to repeat in Dec. Discussed diet and supplements. Advised daily vitamin not necessary given varied diet but could continue.         >30 minutes spent in face to face time with patient, >50% spent in counselling or coordination of care    Return in about 6 months (around 04/04/2020) for annual.  Lesleigh Noe, MD

## 2019-10-04 NOTE — Patient Instructions (Addendum)
Call back with the name of the specialist you saw  I will get the records  And follow-up with you regarding what type of specialist if same one, then we will just continue otherwise I will place a new referral for a specialist

## 2019-10-04 NOTE — Assessment & Plan Note (Signed)
Valacyclovir for flare ups which is effective.

## 2019-10-05 ENCOUNTER — Telehealth: Payer: Self-pay | Admitting: Family Medicine

## 2019-10-05 DIAGNOSIS — M79641 Pain in right hand: Secondary | ICD-10-CM

## 2019-10-05 NOTE — Telephone Encounter (Signed)
Patient called.  Patient saw Dr.Cody yesterday.  Patient was asked to call back with specialist she saw for her hand. She was seen at [facility] the fax number is [phone].

## 2019-10-06 NOTE — Telephone Encounter (Signed)
Faxed request

## 2019-10-06 NOTE — Telephone Encounter (Signed)
Routing to MA to help get records request sent

## 2019-10-07 ENCOUNTER — Ambulatory Visit: Payer: BC Managed Care – PPO

## 2019-10-11 ENCOUNTER — Ambulatory Visit
Admission: RE | Admit: 2019-10-11 | Discharge: 2019-10-11 | Disposition: A | Discharge status: Home / Self Care | Payer: BC Managed Care – PPO | Source: Ambulatory Visit | Attending: Family Medicine | Admitting: Family Medicine

## 2019-10-11 ENCOUNTER — Other Ambulatory Visit: Payer: Self-pay

## 2019-10-11 DIAGNOSIS — Z1231 Encounter for screening mammogram for malignant neoplasm of breast: Secondary | ICD-10-CM | POA: Diagnosis not present

## 2019-10-18 NOTE — Telephone Encounter (Signed)
Patient called about request.   She wanted to know if we have received the records back from that office

## 2019-10-18 NOTE — Telephone Encounter (Signed)
Left message on pts voicemail, per DPR, stating that I resent the health record request, via fax, to [facility].

## 2019-10-18 NOTE — Telephone Encounter (Signed)
Have not received records.   Routing to MA to follow-up with outside office about records

## 2019-10-25 NOTE — Telephone Encounter (Signed)
Received outside rheumatology records which had reassuring work-up.   Placing referral to neurology for further testing given persistent symptoms.   Will send MyChart to patient.

## 2019-10-25 NOTE — Addendum Note (Signed)
Addended by: Lynnda Child on: 10/25/2019 03:38 PM   Modules accepted: Orders

## 2019-10-27 NOTE — Telephone Encounter (Signed)
Left Vm (DPR) notifying pt that we received her rheumatology records and a referral has been placed for neurology.

## 2019-10-28 ENCOUNTER — Telehealth: Payer: Self-pay | Admitting: Family Medicine

## 2019-10-28 NOTE — Telephone Encounter (Signed)
Left VM on pt's cell phone relaying Dr. Elmyra Ricks message.

## 2019-10-28 NOTE — Telephone Encounter (Signed)
Patient called back about results for her hand.  She stated she had imaging done and wanted to know the results. She stated someone called her about a referral for this but she doesn't want to schedule an appointment until she knows what the results of the imaging was./ and what was found ?

## 2019-10-28 NOTE — Telephone Encounter (Signed)
Please clarify what imaging she is referring to?   I have not ordered hand imaging for this patient. If she was ordered by the Rheumatologist I would recommend that she contact their office to discuss the results.

## 2019-12-06 ENCOUNTER — Encounter: Payer: BC Managed Care – PPO | Admitting: Obstetrics & Gynecology

## 2019-12-06 ENCOUNTER — Ambulatory Visit: Payer: BC Managed Care – PPO | Admitting: Obstetrics & Gynecology

## 2019-12-06 ENCOUNTER — Encounter: Payer: Self-pay | Admitting: Obstetrics & Gynecology

## 2019-12-06 ENCOUNTER — Other Ambulatory Visit: Payer: Self-pay

## 2019-12-06 VITALS — BP 130/86

## 2019-12-06 DIAGNOSIS — R87810 Cervical high risk human papillomavirus (HPV) DNA test positive: Secondary | ICD-10-CM

## 2019-12-06 DIAGNOSIS — N870 Mild cervical dysplasia: Secondary | ICD-10-CM

## 2019-12-06 NOTE — Progress Notes (Signed)
    Sabrina Randall [birth year] [id number]        44 y.o.  [id number]   RP: CIN 1 or higher with HPV 16 pos for Colposcopy  HPI: Colposcopy 07/2019 showed at least CIN 1 with HPV 16 pos.  Repeat Colpo today.   OB History  Gravida Para Term Preterm AB Living  2 2 1 1   2   SAB TAB Ectopic Multiple Live Births               # Outcome Date GA Lbr Len/2nd Weight Sex Delivery Anes PTL Lv  2 Preterm           1 Term             Past medical history,surgical history, problem list, medications, allergies, family history and social history were all reviewed and documented in the EPIC chart.   Directed ROS with pertinent positives and negatives documented in the history of present illness/assessment and plan.  Exam:  Vitals:   12/06/19 1215  BP: 130/86   General appearance:  Normal  Colposcopy Procedure Note Sabrina Randall 12/06/2019  Indications: CIN1 or higher/HPV 16 pos  Procedure Details  The risks and benefits of the procedure and Verbal informed consent obtained.  Speculum placed in vagina and excellent visualization of cervix achieved, cervix swabbed x 3 with acetic acid solution.  Findings:  Cervix colposcopy: Physical Exam Genitourinary:       Vaginal colposcopy: Normal  Vulvar colposcopy: Normal  Perirectal colposcopy: Normal  The cervix was sprayed with Hurricane before performing the cervical biopsies.  Specimens: Cervical Bx 07-25-08-2 O'Clock  Complications:  None, good hemostasis with Silver Nitrate . Plan:  Management per Cervical Bx results.   Assessment/Plan:  44 y.o. G2P1102   1. Dysplasia of cervix, low grade (CIN 1) Colposcopy April 2021 showed at least CIN-1.  HPV 16+.  Repeat colposcopy today.  Colposcopy findings reviewed with patient.  Postprocedure precautions discussed.  Management per cervical biopsy results.  Patient explained that if she has more than mild dysplasia, a LEEP procedure will be indicated.  2.  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

## 2019-12-06 NOTE — Addendum Note (Signed)
Addended by: Berna Spare A on: 12/06/2019 02:06 PM   Modules accepted: Orders

## 2019-12-08 LAB — TISSUE PATH REPORT

## 2019-12-08 LAB — PATHOLOGY REPORT

## 2020-01-03 ENCOUNTER — Ambulatory Visit: Payer: BC Managed Care – PPO | Admitting: Neurology

## 2020-01-03 ENCOUNTER — Encounter: Payer: Self-pay | Admitting: Neurology

## 2020-01-03 VITALS — BP 128/86 | HR 76 | Ht 59.5 in | Wt 120.0 lb

## 2020-01-03 DIAGNOSIS — G5603 Carpal tunnel syndrome, bilateral upper limbs: Secondary | ICD-10-CM | POA: Diagnosis not present

## 2020-01-03 DIAGNOSIS — R202 Paresthesia of skin: Secondary | ICD-10-CM | POA: Diagnosis not present

## 2020-01-03 MED ORDER — GABAPENTIN 100 MG PO CAPS: 300.0000 mg | ORAL_CAPSULE | Freq: Every day | ORAL | 6 refills | Status: DC

## 2020-01-03 NOTE — Progress Notes (Signed)
Chief Complaint  Patient presents with  . New Patient (Initial Visit)    Referred to have her bilateral hand pain further evaluated.  Marland Kitchen PCP    Sabrina Noe, MD    HISTORICAL  Sabrina Randall is 44 year old right-handed female, seen in request by her primary care physician Dr. Einar Randall, Sabrina Randall for evaluation of bilateral hands paresthesia, right worse than left, initial evaluation was on January 03, 2020  I reviewed and summarized the referring note.  She works at [employer] job, which requires repetitive hand motion, this started shortly after her started her new job at [employer] in November 2020, gradually getting worse, she denies difficulty handling her job, which require lifting up to 50 pounds, denies gait abnormality, denies significant neck or low back pain, mostly asymptomatic during the day such as driving, or using her cell phone, most bothersome symptoms early morning, after overnight sleep, she felt her hands numbness, stiff, she has to gradually move them twice weekly type, right much worse than left, she wear wrist splint, only provide partial relief, Laboratory evaluation December 2020, positive ANA with titer of 1-40, negative rheumatoid factor, normal ESR 17, vitamin D 51, ferritin was 10, hemoglobin was 14.2, CMP was normal, lipid profile LDL was 118  REVIEW OF SYSTEMS: Full 14 system review of systems performed and notable only for as above All other review of systems were negative.  ALLERGIES: No Known Allergies  HOME MEDICATIONS: Current Outpatient Medications  Medication Sig Dispense Refill  . ibuprofen (ADVIL) 200 MG tablet Take 200 mg by mouth every 6 (six) hours as needed.    . Melatonin 10 MG TABS Take by mouth.    . Misc Natural Products (TART CHERRY ADVANCED PO) Take by mouth 2 (two) times daily.    . valACYclovir (VALTREX) 1000 MG tablet Take one tab po daily beginning at first signs of outbreak. Take for 5 days. 15 tablet 2   No current  facility-administered medications for this visit.    PAST MEDICAL HISTORY: Past Medical History:  Diagnosis Date  . ASCUS with positive high risk HPV 16 on cervical pap 04/15/18 01/16/2016   Pap done at same time as colposcopy for unsatisfactory pap with HPV 16 done on 03/17/18. Benign colposcopy biopsies on 04/15/18, repeat cotesting in one year  . Hand pain    bilateral    PAST SURGICAL HISTORY: Past Surgical History:  Procedure Laterality Date  . GANGLION CYST EXCISION Right    wrist    FAMILY HISTORY: Family History  Problem Relation Age of Onset  . Brain cancer Paternal Uncle   . Other Mother        rabies from dog bite  . Arthritis Father   . Heart attack Neg Hx   . Stroke Neg Hx     SOCIAL HISTORY: Social History   Socioeconomic History  . Marital status: Divorced    Spouse name: Not on file  . Number of children: 2  . Years of education: High school  . Highest education level: Not on file  Occupational History  . Occupation: Designer, jewellery  Tobacco Use  . Smoking status: Former Smoker    Quit date: 1998    Years since quitting: 23.7  . Smokeless tobacco: Never Used  Vaping Use  . Vaping Use: Never used  Substance and Sexual Activity  . Alcohol use: Yes    Comment: weekends, 1-2 glasses  . Drug use: No  . Sexual activity: Yes    Partners:  Male    Birth control/protection: Condom    Comment: 1st intercourse- 61, partners- ?, married- 59 yrs  Other Topics Concern  . 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   Right-handed.   Social Determinants of Health   Financial Resource Strain:   . Difficulty of Paying Living Expenses: Not on file  Food Insecurity:   . Worried About Charity fundraiser in the Last Year: Not on file  . Ran Out of Food in the Last Year: Not on file  Transportation Needs:   . Lack of Transportation (Medical): Not on file  . Lack of Transportation (Non-Medical): Not on file  Physical Activity:   . Days of Exercise per Week: Not on file  . Minutes of Exercise per Session: Not on file  Stress:   . Feeling of Stress : Not on file  Social Connections:   . Frequency of Communication with Friends and Family: Not on file  . Frequency of Social Gatherings with Friends and Family: Not on file  . Attends Religious Services: Not on file  . Active Member of Clubs or Organizations: Not on file  . Attends Archivist Meetings: Not on file  . Marital Status: Not on file  Intimate Partner Violence:   . Fear of Current or Ex-Partner: Not on file  . Emotionally Abused: Not on file  . Physically Abused: Not on file  . Sexually Abused: Not on file     PHYSICAL EXAM   Vitals:   01/03/20 0753  BP: 128/86  Pulse: 76  Weight: 120 lb (54.4 kg)  Height: 4' 11.5" (1.511 m)   Not recorded     Body mass index is 23.83 kg/m.  PHYSICAL EXAMNIATION:  Gen: NAD, conversant, well nourised, well groomed                     Cardiovascular: Regular rate rhythm, no peripheral edema, warm, nontender. Eyes: Conjunctivae clear without exudates or hemorrhage Neck: Supple, no carotid bruits. Pulmonary: Clear to auscultation bilaterally   NEUROLOGICAL EXAM:  MENTAL STATUS: Speech:    Speech is normal; fluent and spontaneous with normal comprehension.  Cognition:     Orientation to time, place and person     Normal recent and remote memory     Normal Attention span and concentration     Normal Language, naming,  repeating,spontaneous speech     Fund of knowledge   CRANIAL NERVES: CN II: Visual fields are full to confrontation. Pupils are round equal and briskly reactive to light. CN III, IV, VI: extraocular movement are normal. No ptosis. CN V: Facial sensation is intact to light touch CN VII: Face is symmetric with normal eye closure  CN VIII: Hearing is normal to causal conversation. CN IX, X: Phonation is normal. CN XI:  Head turning and shoulder shrug are intact  MOTOR: There is no pronator drift of out-stretched arms. Muscle bulk and tone are normal. Muscle strength is normal.  REFLEXES: Reflexes are 2+ and symmetric at the biceps, triceps, knees, and ankles. Plantar responses are flexor.  SENSORY: Intact to light touch, pinprick and vibratory sensation are intact in fingers and toes.  COORDINATION: There is no trunk or limb dysmetria noted.  GAIT/STANCE: Posture is normal. Gait is steady with normal steps, base, arm swing, and turning. Heel and toe walking are normal. Tandem gait is normal.  Romberg is absent.   DIAGNOSTIC DATA (LABS, IMAGING, TESTING) - I reviewed patient records, labs, notes, testing and imaging myself where available.   ASSESSMENT AND PLAN  Sabrina Randall is a 44 y.o. female   Bilateral hands paresthesia  Most suggestive of carpal tunnel syndrome  EMG nerve conduction study  Continue with the splint,  NSAIDs as needed  Gabapentin 100 up to 3 tablets every night  Laboratory evaluations for inflammatory markers,    Marcial Pacas, M.D. Ph.D.  [facility] [address], [place], [place] [address] Ph: [phone] Fax: [phone]  CC:  Sabrina Noe, MD [place],  [place] [address]

## 2020-01-04 LAB — C-REACTIVE PROTEIN: CRP: 1 mg/L (ref 0–10)

## 2020-01-04 LAB — ANA W/REFLEX: Anti Nuclear Antibody (ANA): NEGATIVE

## 2020-01-04 LAB — SEDIMENTATION RATE: Sed Rate: 9 mm/hr (ref 0–32)

## 2020-01-04 LAB — CK: Total CK: 192 U/L — ABNORMAL HIGH (ref 32–182)

## 2020-02-01 ENCOUNTER — Other Ambulatory Visit: Payer: Self-pay

## 2020-02-01 ENCOUNTER — Ambulatory Visit: Payer: BC Managed Care – PPO | Admitting: Neurology

## 2020-02-01 ENCOUNTER — Ambulatory Visit (INDEPENDENT_AMBULATORY_CARE_PROVIDER_SITE_OTHER): Payer: BC Managed Care – PPO | Admitting: Neurology

## 2020-02-01 DIAGNOSIS — G5603 Carpal tunnel syndrome, bilateral upper limbs: Secondary | ICD-10-CM

## 2020-02-01 DIAGNOSIS — Z0289 Encounter for other administrative examinations: Secondary | ICD-10-CM

## 2020-02-01 DIAGNOSIS — R202 Paresthesia of skin: Secondary | ICD-10-CM

## 2020-02-01 NOTE — Procedures (Signed)
Full Name: Sabrina Randall Gender: Female MRN #: [id number] Date of Birth: 02/11/1976    Visit Date: 02/01/2020 07:26 Age: 44 Years Examining Physician: Levert Feinstein, MD  Referring Physician: Levert Feinstein, MD Height: 4 feet 11 inch History: 44 years female showed bilateral hands paresthesia, swollen  Summary of the tests: Nerve Conduction Study: Bilateral median, ulnar, median mixed responses were within normal limits.  Bilateral ulnar, median motor responses were normal.  Electromyography,  Selected needle examination of right upper extremity muscles were normal.   Conclusion: This is a normal study.  There is no electrodiagnostic evidence of right upper extremity neuropathy, or right cervical radiculopathy.    ------------------------------- Levert Feinstein, M.D. PhD  [facility] [address], [address] [place], [place] [address] Tel: [phone] Fax: [phone]  Verbal informed consent was obtained from the patient, patient was informed of potential risk of procedure, including bruising, bleeding, hematoma formation, infection, muscle weakness, muscle pain, numbness, among others.        MNC    Nerve / Sites Muscle Latency Ref. Amplitude Ref. Rel Amp Segments Distance Velocity Ref. Area    ms ms mV mV %  cm m/s m/s mVms  L Median - APB     Wrist APB 3.1 ?4.4 7.9 ?4.0 100 Wrist - APB 7   32.2     Upper arm APB 6.2  7.9  100 Upper arm - Wrist 18 58 ?49 31.9  R Median - APB     Wrist APB 2.9 ?4.4 7.0 ?4.0 100 Wrist - APB 7   28.3     Upper arm APB 6.3  6.9  98.1 Upper arm - Wrist 19 57 ?49 27.4  L Ulnar - ADM     Wrist ADM 2.4 ?3.3 9.4 ?6.0 100 Wrist - ADM 7   32.0     B.Elbow ADM 4.9  9.1  96.6 B.Elbow - Wrist 17 66 ?49 31.8     A.Elbow ADM 6.5  8.9  98.3 A.Elbow - B.Elbow 10 66 ?49 31.7  R Ulnar - ADM     Wrist ADM 2.2 ?3.3 10.3 ?6.0 100 Wrist - ADM 7   41.4     B.Elbow ADM 4.8  10.2  98.6 B.Elbow - Wrist 17 67 ?49 39.6     A.Elbow ADM  6.3  10.1  99.1 A.Elbow - B.Elbow 10 66 ?49 39.3             SNC    Nerve / Sites Rec. Site Peak Lat Ref.  Amp Ref. Segments Distance Peak Diff Ref.    ms ms V V  cm ms ms  L Median, Ulnar - Transcarpal comparison     Median Palm Wrist 2.2 ?2.2 88 ?35 Median Palm - Wrist 8       Ulnar Palm Wrist 2.1 ?2.2 29 ?12 Ulnar Palm - Wrist 8          Median Palm - Ulnar Palm  0.2 ?0.4  R Median, Ulnar - Transcarpal comparison     Median Palm Wrist 2.2 ?2.2 94 ?35 Median Palm - Wrist 8       Ulnar Palm Wrist 2.0 ?2.2 32 ?12 Ulnar Palm - Wrist 8          Median Palm - Ulnar Palm  0.2 ?0.4  L Median - Orthodromic (Dig II, Mid palm)     Dig II Wrist 3.0 ?3.4 17 ?10 Dig II - Wrist 13  R Median - Orthodromic (Dig II, Mid palm)     Dig II Wrist 3.0 ?3.4 21 ?10 Dig II - Wrist 13    L Ulnar - Orthodromic, (Dig V, Mid palm)     Dig V Wrist 2.6 ?3.1 8 ?5 Dig V - Wrist 11    R Ulnar - Orthodromic, (Dig V, Mid palm)     Dig V Wrist 2.5 ?3.1 11 ?5 Dig V - Wrist 30                   F  Wave    Nerve F Lat Ref.   ms ms  L Ulnar - ADM 22.9 ?32.0  R Ulnar - ADM 24.5 ?32.0         EMG Summary Table    Spontaneous MUAP Recruitment  Muscle IA Fib PSW Fasc Other Amp Dur. Poly Pattern  R. First dorsal interosseous Normal None None None _______ Normal Normal Normal Normal  R. Abductor pollicis brevis Normal None None None _______ Normal Normal Normal Normal  R. Pronator teres Normal None None None _______ Normal Normal Normal Normal  R. Biceps brachii Normal None None None _______ Normal Normal Normal Normal  R. Deltoid Normal None None None _______ Normal Normal Normal Normal  R. Extensor digitorum communis Normal None None None _______ Normal Normal Normal Normal

## 2020-04-10 ENCOUNTER — Encounter: Payer: Self-pay | Admitting: Family Medicine

## 2020-04-10 ENCOUNTER — Ambulatory Visit (INDEPENDENT_AMBULATORY_CARE_PROVIDER_SITE_OTHER): Payer: BC Managed Care – PPO | Admitting: Family Medicine

## 2020-04-10 ENCOUNTER — Other Ambulatory Visit: Payer: Self-pay

## 2020-04-10 VITALS — BP 110/60 | HR 87 | Temp 97.6°F | Ht 60.0 in | Wt 121.5 lb

## 2020-04-10 DIAGNOSIS — E785 Hyperlipidemia, unspecified: Secondary | ICD-10-CM

## 2020-04-10 DIAGNOSIS — R42 Dizziness and giddiness: Secondary | ICD-10-CM | POA: Insufficient documentation

## 2020-04-10 DIAGNOSIS — Z0001 Encounter for general adult medical examination with abnormal findings: Secondary | ICD-10-CM

## 2020-04-10 NOTE — Assessment & Plan Note (Signed)
In setting of recent blood donation. Advised she monitor and will delay labs for 3-4 weeks.

## 2020-04-10 NOTE — Addendum Note (Signed)
Addended by: Aquilla Solian on: 04/10/2020 02:21 PM   Modules accepted: Orders

## 2020-04-10 NOTE — Patient Instructions (Addendum)
Call back with location for colonoscopy   Keep an eye out for the dizzy spells and let me know if they continue  Return in 3-4 weeks for your blood work

## 2020-04-10 NOTE — Progress Notes (Signed)
Annual Exam   Chief Complaint:  Chief Complaint  Patient presents with  . Annual Exam  . Dizziness    Episodes with sweats, lightheaded, and dizzy over last month     History of Present Illness:  [name] is a 44 y.o. [id number] who LMP was No LMP recorded. Patient is premenopausal., presents today for her annual examination.    #Dizzy spell - on Sunday - gave blood on Saturday - does not drink as much on the weekends as she does during the week - also occurred a few months ago - not sure if she is getting hot flashes or not - starting to get symptoms of menopause   Nutrition Diet: eats every 2-3 hours, high protein diet, tries to stay hydrated Exercise: walking at work and lifting at work She does get adequate calcium and Vitamin D in her diet.   Social History   Tobacco Use  Smoking Status Former Smoker  . Quit date: 72  . Years since quitting: 23.9  Smokeless Tobacco Never Used   Social History   Substance and Sexual Activity  Alcohol Use Yes   Comment: weekends, 1-2 glasses   Social History   Substance and Sexual Activity  Drug Use No    Safety The patient wears seatbelts: yes.     The patient feels safe at home and in their relationships: yes.  General Health Dentist in the last year: Yes Eye doctor: yes  Menstrual Irregular periods Getting some hot flashes  GYN She is single partner, contraception - none.    Cervical Cancer Screening:   Colposcopy 11/2019 which was normal Has f/u in February 2022   Breast Cancer Screening There is no FH of breast cancer. There is no FH of ovarian cancer. BRCA screening Not Indicated.  Discussed that for average risk women between age 93-49 screening may reduce the risk of breast cancer death, however, at a lower rate than those over age 42. And that the the false-positive rates resulting in unnecessary biopsies with more screening is higher. The balance of benefits vs harms likely  improves as you progress through your 40s. The patient does want a mammogram this year.   Colon Cancer Screening:  Age 1-75 yo - benefits outweigh the risk. Adults 45-85 yo who have never been screened benefit.  Benefits: 134000 people in 2016 will be diagnosed and 49,000 will die - early detection helps Harms: Complications 2/2 to colonoscopy High Risk (Colonoscopy): genetic disorder (Lynch syndrome or familial adenomatous polyposis), personal hx of IBD, previous adenomatous polyp, or previous colorectal cancer, FamHx start 10 years before the age at diagnosis, increased in males and black race  Options:  FIT - looks for hemoglobin (blood in the stool) - specific and fairly sensitive - must be done annually Cologuard - looks for DNA and blood - more sensitive - therefore can have more false positives, every 3 years Colonoscopy - every 10 years if normal - sedation, bowl prep, must have someone drive you  Shared decision making and the patient had decided to do colonscopy.  Weight Wt Readings from Last 3 Encounters:  04/10/20 121 lb 8 oz (55.1 kg)  01/03/20 120 lb (54.4 kg)  10/04/19 113 lb (51.3 kg)   Patient has normal BMI  BMI Readings from Last 1 Encounters:  04/10/20 23.73 kg/m     Chronic disease screening Blood pressure monitoring:  BP Readings from Last 3 Encounters:  04/10/20 110/60  01/03/20 128/86  12/06/19 130/86  Lipid Monitoring: Indication for screening: age >61, obesity, diabetes, family hx, CV risk factors.  Lipid screening: Yes  Lab Results  Component Value Date   CHOL 198 03/28/2019   HDL 64.20 03/28/2019   LDLCALC 118 (H) 03/28/2019   LDLDIRECT 133.5 04/24/2011   TRIG 80.0 03/28/2019   CHOLHDL 3 03/28/2019     Diabetes Screening: age >85, overweight, family hx, PCOS, hx of gestational diabetes, at risk ethnicity Diabetes Screening screening: Yes  No results found for: HGBA1C   Past Medical History:  Diagnosis Date  . 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 Benign colposcopy biopsies on 04/15/18, repeat cotesting in one year  . Hand pain    bilateral    Past Surgical History:  Procedure Laterality Date  . GANGLION CYST EXCISION Right    wrist    Prior to Admission medications   Medication Sig Start Date End Date Taking? Authorizing Provider  ibuprofen (ADVIL) 200 MG tablet Take 200 mg by mouth every 6 (six) hours as needed.   Yes [provider]  Misc Natural Products (TART CHERRY ADVANCED PO) Take by mouth 2 (two) times daily.   Yes [provider]  valACYclovir (VALTREX) 1000 MG tablet Take one tab po daily beginning at first signs of outbreak. Take for 5 days. 08/04/19  Yes Princess Bruins, MD    No Known Allergies  Gynecologic History: No LMP recorded. Patient is premenopausal.  Obstetric History: [id number]  Social History   Socioeconomic History  . Marital status: Divorced    Spouse name: Not on file  . Number of children: 2  . Years of education: High school  . Highest education level: Not on file  Occupational History  . Occupation: Designer, jewellery  Tobacco Use  . Smoking status: Former Smoker    Quit date: 1998    Years since quitting: 23.9  . Smokeless tobacco: Never Used  Vaping Use  . Vaping Use: Never used  Substance and Sexual Activity  . Alcohol use: Yes    Comment: weekends, 1-2 glasses  . Drug use: No  . Sexual activity: Yes    Partners: Male    Birth control/protection: Condom    Comment: 1st intercourse- 30, partners- ?, married- 88 yrs  Other Topics Concern  . 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   Right-handed.   Social Determinants of Health   Financial Resource Strain: Not on file  Food Insecurity: Not on file  Transportation Needs: Not on file  Physical Activity: Not on file  Stress: Not on file  Social Connections: Not on file  Intimate Partner Violence: Not on file    Family History  Problem Relation Age  of Onset  . Brain cancer Paternal Uncle   . Other Mother        rabies from dog bite  . Arthritis Father   . Heart attack Neg Hx   . Stroke Neg Hx     Review of Systems  Constitutional: Negative for chills and fever.  HENT: Negative for congestion and sore throat.   Eyes: Negative for blurred vision and double vision.  Respiratory: Negative for shortness of breath.   Cardiovascular: Negative for chest pain.  Gastrointestinal: Negative for heartburn, nausea and vomiting.  Genitourinary: Negative.   Musculoskeletal: Negative.  Negative for myalgias.  Skin: Negative for rash.  Neurological: Positive for dizziness. Negative for headaches.  Endo/Heme/Allergies: Does not bruise/bleed easily.  Psychiatric/Behavioral: Negative for depression. The patient is not nervous/anxious.      Physical Exam BP 110/60   Pulse 87   Temp 97.6 F (36.4 C) (Temporal)   Ht 5' (1.524 m)   Wt 121 lb 8 oz (55.1 kg)   SpO2 98%   BMI 23.73 kg/m    BP Readings from Last 3 Encounters:  04/10/20 110/60  01/03/20 128/86  12/06/19 130/86      Physical Exam Constitutional:      General: She is not in acute distress.    Appearance: She is well-developed and well-nourished. She is not diaphoretic.  HENT:     Head: Normocephalic and atraumatic.     Right Ear: External ear normal.     Left Ear: External  ear normal.     Nose: Nose normal.     Mouth/Throat:     Mouth: Oropharynx is clear and moist.  Eyes:     General: No scleral icterus.    Extraocular Movements: EOM normal.     Conjunctiva/sclera: Conjunctivae normal.  Cardiovascular:     Rate and Rhythm: Normal rate and regular rhythm.     Heart sounds: No murmur heard.   Pulmonary:     Effort: Pulmonary effort is normal. No respiratory distress.     Breath sounds: Normal breath sounds. No wheezing.  Abdominal:     General: Bowel sounds are normal. There is no distension.     Palpations: Abdomen is soft. There is no mass.     Tenderness: There is no abdominal tenderness. There is no guarding or rebound.  Musculoskeletal:        General: No edema. Normal range of motion.     Cervical back: Neck supple.  Lymphadenopathy:     Cervical: No cervical adenopathy.  Skin:    General: Skin is warm and dry.     Capillary Refill: Capillary refill takes less than 2 seconds.  Neurological:     Mental Status: She is alert and oriented to person, place, and time.     Deep Tendon Reflexes: Strength normal. Reflexes normal.  Psychiatric:        Mood and Affect: Mood and affect normal.        Behavior: Behavior normal.      Results:  PHQ-9:  [place] Office Visit from 03/28/2019 in [facility]  PHQ-9 Total Score 2        Assessment: 44 y.o. [id number] female here for routine annual physical examination.  Plan: Problem List Items Addressed This Visit      Other   HLD (hyperlipidemia)   Relevant Orders   Lipid panel   Dizziness    In setting of recent blood donation. Advised she monitor and will delay labs for  3-4 weeks.       Relevant Orders   CBC   Comprehensive metabolic panel    Other Visit Diagnoses    Encounter for annual general medical examination with abnormal findings in adult    -  Primary   Relevant Orders   CBC   Comprehensive metabolic panel      Screening: -- Blood pressure  screen normal -- cholesterol screening: will obtain -- Weight screening: normal -- Diabetes Screening: will obtain -- Nutrition: Encouraged healthy diet  The 10-year ASCVD risk score Mikey Bussing DC Jr., et al., 2013) is: 0.5%   Values used to calculate the score:     Age: 13 years     Sex: Female     Is Non-Hispanic African American: No     Diabetic: No     Tobacco smoker: No     Systolic Blood Pressure: 213 mmHg     Is BP treated: No     HDL Cholesterol: 64.2 mg/dL     Total Cholesterol: 198 mg/dL  -- Statin therapy for Age 69-75 with CVD risk >7.5%  Psych -- Depression screening (PHQ-9):  [place] Office Visit from 03/28/2019 in [facility]  PHQ-9 Total Score 2       Safety -- tobacco screening: not using -- alcohol screening:  low-risk usage. -- no evidence of domestic violence or intimate partner violence.   Cancer Screening -- pap smear not collected - hx of abnormal and colpo with GYN done in August 2021 -- family history of breast cancer screening: done. not at high risk. -- Mammogram - up to date -- Colon cancer (age 30+)-- she will call back with location preference for colonoscopy  Immunizations Immunization History  Administered Date(s) Administered  . Influenza,inj,Quad PF,6+ Mos 01/10/2015, 01/16/2016, 04/15/2018, 01/21/2019  . PFIZER SARS-COV-2 Vaccination 08/12/2019, 09/02/2019  . Tdap 03/28/2019    -- flu vaccine up to date -- TDAP q10 years up to date -- Covid-19 Vaccine up to date   Encouraged healthy diet and exercise. Encouraged regular vision and dental care.   Lesleigh Noe, MD

## 2020-05-16 ENCOUNTER — Other Ambulatory Visit (INDEPENDENT_AMBULATORY_CARE_PROVIDER_SITE_OTHER): Payer: BC Managed Care – PPO

## 2020-05-16 ENCOUNTER — Other Ambulatory Visit: Payer: Self-pay

## 2020-05-16 DIAGNOSIS — E785 Hyperlipidemia, unspecified: Secondary | ICD-10-CM

## 2020-05-16 DIAGNOSIS — R42 Dizziness and giddiness: Secondary | ICD-10-CM | POA: Diagnosis not present

## 2020-05-16 DIAGNOSIS — Z0001 Encounter for general adult medical examination with abnormal findings: Secondary | ICD-10-CM | POA: Diagnosis not present

## 2020-05-17 ENCOUNTER — Other Ambulatory Visit: Payer: Self-pay | Admitting: Family Medicine

## 2020-05-17 DIAGNOSIS — Z1211 Encounter for screening for malignant neoplasm of colon: Secondary | ICD-10-CM

## 2020-05-17 LAB — CBC
HCT: 34 % — ABNORMAL LOW (ref 36.0–46.0)
Hemoglobin: 11 g/dL — ABNORMAL LOW (ref 12.0–15.0)
MCHC: 32.4 g/dL (ref 30.0–36.0)
MCV: 80.5 fl (ref 78.0–100.0)
Platelets: 379 10*3/uL (ref 150.0–400.0)
RBC: 4.22 Mil/uL (ref 3.87–5.11)
RDW: 17 % — ABNORMAL HIGH (ref 11.5–15.5)
WBC: 6.3 10*3/uL (ref 4.0–10.5)

## 2020-05-17 LAB — COMPREHENSIVE METABOLIC PANEL
ALT: 14 U/L (ref 0–35)
AST: 21 U/L (ref 0–37)
Albumin: 4.3 g/dL (ref 3.5–5.2)
Alkaline Phosphatase: 95 U/L (ref 39–117)
BUN: 15 mg/dL (ref 6–23)
CO2: 26 mEq/L (ref 19–32)
Calcium: 9.4 mg/dL (ref 8.4–10.5)
Chloride: 103 mEq/L (ref 96–112)
Creatinine, Ser: 0.59 mg/dL (ref 0.40–1.20)
GFR: 108.8 mL/min (ref >60.00)
Glucose, Bld: 93 mg/dL (ref 70–99)
Potassium: 3.6 mEq/L (ref 3.5–5.1)
Sodium: 137 mEq/L (ref 135–145)
Total Bilirubin: 0.6 mg/dL (ref 0.2–1.2)
Total Protein: 7.2 g/dL (ref 6.0–8.3)

## 2020-05-17 LAB — LIPID PANEL
Cholesterol: 226 mg/dL — ABNORMAL HIGH (ref 0–200)
HDL: 67.3 mg/dL (ref >39.00)
NonHDL: 158.59
Total CHOL/HDL Ratio: 3
Triglycerides: 227 mg/dL — ABNORMAL HIGH (ref 0.0–149.0)
VLDL: 45.4 mg/dL — ABNORMAL HIGH (ref 0.0–40.0)

## 2020-05-17 LAB — LDL CHOLESTEROL, DIRECT: Direct LDL: 139 mg/dL

## 2020-05-18 ENCOUNTER — Other Ambulatory Visit (INDEPENDENT_AMBULATORY_CARE_PROVIDER_SITE_OTHER): Payer: BC Managed Care – PPO

## 2020-05-18 DIAGNOSIS — D508 Other iron deficiency anemias: Secondary | ICD-10-CM

## 2020-05-18 LAB — FERRITIN: Ferritin: 6.8 ng/mL — ABNORMAL LOW (ref 10.0–291.0)

## 2020-05-18 NOTE — Addendum Note (Signed)
Addended by: Miguel Aschoff on: 05/18/2020 09:24 AM   Modules accepted: Orders

## 2020-05-28 ENCOUNTER — Telehealth: Payer: Self-pay | Admitting: Family Medicine

## 2020-05-28 DIAGNOSIS — E785 Hyperlipidemia, unspecified: Secondary | ICD-10-CM

## 2020-05-28 NOTE — Telephone Encounter (Signed)
Pt called in wanted to know about getting a referral for a nutrientist

## 2020-05-29 NOTE — Telephone Encounter (Signed)
Ok with nutrition/dietician referral. Routing to coordinator to figure out best way to order.

## 2020-05-29 NOTE — Telephone Encounter (Signed)
Spoke with a scheduler at [facility] nutrition office and patient can be sent there as long as their is a diagnoses to file this under, like HTN, hyperlipidemia, obesity, etc. I called patient and left her a message to see if she had any particular reason for wanting to see nutritionist or due to her high cholesterol

## 2020-05-30 NOTE — Telephone Encounter (Signed)
Referral placed.

## 2020-05-30 NOTE — Addendum Note (Signed)
Addended by: Gweneth Dimitri R on: 05/30/2020 12:50 PM   Modules accepted: Orders

## 2020-05-30 NOTE — Telephone Encounter (Signed)
Spoke with patient and she did want to see nutritionist for her high cholesterol. Dr Selena Batten please place referral with that diagnoses and patient wants to go to [facility] location, patient is aware I will send referral and [facility] will call her directly to schedule and go over her benefits and cost.

## 2020-05-31 ENCOUNTER — Other Ambulatory Visit: Payer: Self-pay

## 2020-05-31 DIAGNOSIS — R87612 Low grade squamous intraepithelial lesion on cytologic smear of cervix (LGSIL): Secondary | ICD-10-CM

## 2020-06-05 ENCOUNTER — Other Ambulatory Visit: Payer: Self-pay

## 2020-06-05 ENCOUNTER — Encounter: Payer: Self-pay | Admitting: Obstetrics & Gynecology

## 2020-06-05 ENCOUNTER — Ambulatory Visit: Payer: BC Managed Care – PPO | Admitting: Obstetrics & Gynecology

## 2020-06-05 VITALS — BP 100/64 | HR 83 | Ht 60.0 in | Wt 119.8 lb

## 2020-06-05 DIAGNOSIS — Z01812 Encounter for preprocedural laboratory examination: Secondary | ICD-10-CM | POA: Diagnosis not present

## 2020-06-05 DIAGNOSIS — Z23 Encounter for immunization: Secondary | ICD-10-CM | POA: Diagnosis not present

## 2020-06-05 DIAGNOSIS — N914 Secondary oligomenorrhea: Secondary | ICD-10-CM | POA: Diagnosis not present

## 2020-06-05 DIAGNOSIS — N870 Mild cervical dysplasia: Secondary | ICD-10-CM

## 2020-06-05 LAB — PREGNANCY, URINE: Preg Test, Ur: NEGATIVE

## 2020-06-05 NOTE — Progress Notes (Signed)
    Sabrina Randall 10-27-1975 [id number]        45 y.o.  G2P1102   RP: H/O CIN 1/Cannot r/o CIN 2 for f/u Colposcopy  HPI: Colpo 11/2019 CIN 1, cannot r/o CIN 2.  HPV 16-18-45 Neg at that time, but h/o HPV 16 pos.   OB History  Gravida Para Term Preterm AB Living  2 2 1 1   2   SAB IAB Ectopic Multiple Live Births               # Outcome Date GA Lbr Len/2nd Weight Sex Delivery Anes PTL Lv  2 Preterm           1 Term             Past medical history,surgical history, problem list, medications, allergies, family history and social history were all reviewed and documented in the EPIC chart.   Directed ROS with pertinent positives and negatives documented in the history of present illness/assessment and plan.  Exam:  Vitals:   06/05/20 0841  BP: 100/64  Pulse: 83  SpO2: 100%  Weight: 119 lb 12.8 oz (54.3 kg)  Height: 5' (1.524 m)   General appearance:  Normal  Colposcopy Procedure Note Sabrina Randall 06/05/2020  Indications:  H/O CIN1, cannot r/o CIN 2  Procedure Details  The risks and benefits of the procedure and Verbal informed consent obtained.  Speculum placed in vagina and excellent visualization of cervix achieved, cervix swabbed x 3 with acetic acid solution.  Findings:  Cervix colposcopy: Physical Exam Genitourinary:       Vaginal colposcopy: Normal  Vulvar colposcopy: Normal  Perirectal colposcopy: Normal  The cervix was sprayed with Hurricane before performing the cervical biopsies.  Specimens:  Cervical Bxs at 2-4 and 10 O'Clock  Complications: None, good hemostasis with Silver Nitrate . Plan:  Management per Cervical Bx results  UPT Neg   Assessment/Plan:  45 y.o. G2P1102   1. Dysplasia of cervix, low grade (CIN 1) Colposcopy findings reviewed with patient.  Post procedure precautions reviewed.  Management per Cervical Bx results. - Colposcopy  2. Secondary oligomenorrhea Using condoms.  UPT Negative. -  Pregnancy, urine  3. Need for HPV vaccination Will verify insurance coverage.  May proceed with Gardasil if High Grade histology is present.  45 MD, 9:22 AM 06/05/2020

## 2020-06-08 LAB — PATHOLOGY REPORT

## 2020-06-08 LAB — TISSUE PATH REPORT 10802

## 2020-06-12 ENCOUNTER — Encounter: Payer: Self-pay | Admitting: Obstetrics & Gynecology

## 2020-06-27 DIAGNOSIS — Z20822 Contact with and (suspected) exposure to covid-19: Secondary | ICD-10-CM | POA: Diagnosis not present

## 2020-07-13 ENCOUNTER — Encounter: Payer: Self-pay | Admitting: Dietician

## 2020-07-13 ENCOUNTER — Encounter: Payer: BC Managed Care – PPO | Attending: Family Medicine | Admitting: Dietician

## 2020-07-13 ENCOUNTER — Other Ambulatory Visit: Payer: Self-pay

## 2020-07-13 VITALS — Ht 60.0 in | Wt 122.5 lb

## 2020-07-13 DIAGNOSIS — E785 Hyperlipidemia, unspecified: Secondary | ICD-10-CM

## 2020-07-13 NOTE — Progress Notes (Signed)
Medical Nutrition Therapy: Visit start time: 0815  end time: 0915  Assessment:  Diagnosis: hyperlipidemia Past medical history: iron deficiency Psychosocial issues/ stress concerns: none  Preferred learning method:  . Auditory . Visual . Hands-on  Current weight: 122.5lbs with shoes, jacket  Height: 5'0" Medications, supplements: reconciled list in medical record  Progress and evaluation:   Recent lipid panel (05/16/20) indicates total cholesterol 226, HDL 67.3, VLDL 45, Triglycerides 227; no LDL result.  Hemoglobin is low at 11g/dl. She is trying to include iron-rich foods such as red meats, dark green veg, and raisins.  She uses air fryer to cook meats  Patient reports stable weight, although she would like to weigh 115lbs.  She voices uncertainty of what to choose or avoid to improve blood lipids while also working to avoid iron deficiency anemia.   Physical activity:  On the job walking, handling packages  Dietary Intake:  Usual eating pattern includes 3 meals and 1-2 snacks per day. Dining out frequency: 2-3 meals per week.  Breakfast: 9am -- cereal, milk; 2 boiled/ scrambled eggs with cheese, occ spinach + fruit ie berries or kiwi, coffee; big meal on Sundays at [facility]: sandwich on the way to work ie 3/24 chicken burger with spinach Lunch: 3-4pm takes to work (pre preps meals for the week) --  pork chop/ fish/ shrimp, rice/ potatoes, broccoli steamed; instant flavored oatmeal with peanut butter; yogurt with protein , banana; soups;  Snack: 6-7pm -- protein shake + raisins and nuts or protein bar/ granola bar  or smoothie with kale, spinach, protein powder, coconut water, banana or beets (not canned but pre cooked) Supper: 10pm--  pbj sandwich and chips, milk Snack: eats cough drops throughout the day, chews gum Beverages: 8+ cups water daily, adds lemon, coffee in am, vitamin C mix in packet every other day.  Nutrition Care Education: Topics covered:  Basic  nutrition: general nutrition guidelines    Hyperlipidemia:  target goals for lipids; healthy and unhealthy fats -- choosing lean red meats; limiting high cholesterol foods including egg yolks and shrimp; role of fiber, plant sterols; importance of limiting refined starches and sugars for improving Triglycerides; role of exercise Other:  benefits of making changes, increasing motivation, readiness for change, identifying habits that need to change, alcohol use, food and drug interactions  Nutritional Diagnosis:  -2.2 Altered nutrition-related laboratory As related to hyperlipidemia.  As evidenced by patient with elevated total cholesterol, VLDL, and Triglycerides.  Intervention:  . Instruction and discussion as noted above. . Patient has been making diet and lifestyle changes to improve lipids and iron deficiency. . Established goals for additional change with input from patient.  Education Materials given:  . General diet guidelines for Cholesterol-lowering/ Heart health . Foods to Choose to Lower Cholesterol (AHA) . Fats (Healthy and Unhealthy) (AHA) . Lipoproteins handout . Visit summary with goals/ instructions   Learner/ who was taught:  . Patient   Level of understanding: Marland Kitchen Verbalizes/ demonstrates competency   Demonstrated degree of understanding via:   Teach back Learning barriers: . None  Willingness to learn/ readiness for change: . Eager, change in progress  Monitoring and Evaluation:  Dietary intake, exercise, blood lipids, iron status, and body weight      follow up: 08/22/20 at 8:15am

## 2020-07-13 NOTE — Patient Instructions (Signed)
   Try a whole grain, lower sugar cereal ideally 9g sugar or less (this rule is good for all foods)-- special K Nourish, Cheerios, oatmeal squares. Also try other whole grains -- bread, brown rice, 100% corn tortillas, popcorn, "old fashioned" oatmeal (takes 5 minutes to cook)  [place] job portioning foods and meals and having a good balance of food groups!  Include generous portions of vegetables, medium portions of fruits, meat portions the size of the palm of a hand (2-3oz), and starchy foods the portion of a fisted hand (1 cup) or less.

## 2020-08-22 ENCOUNTER — Ambulatory Visit: Payer: BC Managed Care – PPO | Admitting: Dietician

## 2020-09-11 ENCOUNTER — Other Ambulatory Visit: Payer: Self-pay | Admitting: Family Medicine

## 2020-09-11 DIAGNOSIS — Z1231 Encounter for screening mammogram for malignant neoplasm of breast: Secondary | ICD-10-CM

## 2020-09-24 ENCOUNTER — Encounter: Payer: Self-pay | Admitting: Dietician

## 2020-09-24 NOTE — Progress Notes (Signed)
Have not heard back from patient to reschedule her cancelled appointment from 08/22/20. Sent notification to referring provider.

## 2020-10-10 ENCOUNTER — Encounter: Payer: Self-pay | Admitting: Obstetrics & Gynecology

## 2020-10-10 ENCOUNTER — Ambulatory Visit (INDEPENDENT_AMBULATORY_CARE_PROVIDER_SITE_OTHER): Payer: BC Managed Care – PPO | Admitting: Obstetrics & Gynecology

## 2020-10-10 ENCOUNTER — Other Ambulatory Visit (HOSPITAL_COMMUNITY)
Admission: RE | Admit: 2020-10-10 | Discharge: 2020-10-10 | Disposition: A | Discharge status: Home / Self Care | Payer: BC Managed Care – PPO | Source: Ambulatory Visit | Attending: Obstetrics & Gynecology | Admitting: Obstetrics & Gynecology

## 2020-10-10 ENCOUNTER — Other Ambulatory Visit: Payer: Self-pay

## 2020-10-10 VITALS — BP 118/70 | Ht 60.0 in | Wt 125.0 lb

## 2020-10-10 DIAGNOSIS — Z01419 Encounter for gynecological examination (general) (routine) without abnormal findings: Secondary | ICD-10-CM

## 2020-10-10 DIAGNOSIS — R87810 Cervical high risk human papillomavirus (HPV) DNA test positive: Secondary | ICD-10-CM | POA: Diagnosis not present

## 2020-10-10 DIAGNOSIS — N951 Menopausal and female climacteric states: Secondary | ICD-10-CM

## 2020-10-10 DIAGNOSIS — N870 Mild cervical dysplasia: Secondary | ICD-10-CM

## 2020-10-10 DIAGNOSIS — Z789 Other specified health status: Secondary | ICD-10-CM

## 2020-10-10 NOTE — Progress Notes (Signed)
Sabrina Randall Sabrina Randall August 11, 1975 [id number]   History:    45 y.o. G2P2L2 Married   RP: Established patient presenting for annual gyn exam   HPI: H/O HPV 16 positive in 2019.  Last Colpo 05/2020 CIN 1.  Mild-moderate dysmenorrhea, normal menses every month otherwise.  No pelvic pain.  No pain with IC.  Using condoms.  Urine/BMs normal.  Breasts normal.  Mammo scheduled for 10/2020.  BMI 24.41.  Health labs with Fam MD.    Past medical history,surgical history, family history and social history were all reviewed and documented in the EPIC chart.  Gynecologic History Patient's last menstrual period was 07/10/2020.  Obstetric History OB History  Gravida Para Term Preterm AB Living  2 2 1 1   2   SAB IAB Ectopic Multiple Live Births               # Outcome Date GA Lbr Len/2nd Weight Sex Delivery Anes PTL Lv  2 Preterm           1 Term              ROS: A ROS was performed and pertinent positives and negatives are included in the history.  GENERAL: No fevers or chills. HEENT: No change in vision, no earache, sore throat or sinus congestion. NECK: No pain or stiffness. CARDIOVASCULAR: No chest pain or pressure. No palpitations. PULMONARY: No shortness of breath, cough or wheeze. GASTROINTESTINAL: No abdominal pain, nausea, vomiting or diarrhea, melena or bright red blood per rectum. GENITOURINARY: No urinary frequency, urgency, hesitancy or dysuria. MUSCULOSKELETAL: No joint or muscle pain, no back pain, no recent trauma. DERMATOLOGIC: No rash, no itching, no lesions. ENDOCRINE: No polyuria, polydipsia, no heat or cold intolerance. No recent change in weight. HEMATOLOGICAL: No anemia or easy bruising or bleeding. NEUROLOGIC: No headache, seizures, numbness, tingling or weakness. PSYCHIATRIC: No depression, no loss of interest in normal activity or change in sleep pattern.     Exam:   BP 118/70   Ht 5' (1.524 m)   Wt 125 lb (56.7 kg)   LMP 07/10/2020   BMI 24.41 kg/m   Body  mass index is 24.41 kg/m.  General appearance : Well developed well nourished female. No acute distress HEENT: Eyes: no retinal hemorrhage or exudates,  Neck supple, trachea midline, no carotid bruits, no thyroidmegaly Lungs: Clear to auscultation, no rhonchi or wheezes, or rib retractions  Heart: Regular rate and rhythm, no murmurs or gallops Breast:Examined in sitting and supine position were symmetrical in appearance, no palpable masses or tenderness,  no skin retraction, no nipple inversion, no nipple discharge, no skin discoloration, no axillary or supraclavicular lymphadenopathy Abdomen: no palpable masses or tenderness, no rebound or guarding Extremities: no edema or skin discoloration or tenderness  Pelvic: Vulva: Normal             Vagina: No gross lesions or discharge  Cervix: No gross lesions or discharge.  Pap/HPV HR done.  Uterus  AV, normal size, shape and consistency, non-tender and mobile  Adnexa  Without masses or tenderness  Anus: Normal   Assessment/Plan:  45 y.o. female for annual exam   1. Encounter for routine gynecological examination with Papanicolaou smear of cervix Normal gynecologic exam.  Pap test with high-risk HPV done.  Breast exam normal.  Patient has a screening mammogram scheduled for July.  Good body mass index at that 24.41.  Continue with fitness and healthy nutrition.  Health labs with family physician. - Cytology -  PAP( [place])  2. Cervical high risk HPV (human papillomavirus) test positive - Cytology - PAP( [place])  3. Dysplasia of cervix, low grade (CIN 1) - Cytology - PAP( [place])  4. Perimenopause Menses every 3-4 months with light flow.  Tolerable hot flushes and night sweats.  5. Uses condoms for contraception   Sabrina Del MD, 8:36 AM 10/10/2020

## 2020-10-12 DIAGNOSIS — M9902 Segmental and somatic dysfunction of thoracic region: Secondary | ICD-10-CM | POA: Diagnosis not present

## 2020-10-12 DIAGNOSIS — M9901 Segmental and somatic dysfunction of cervical region: Secondary | ICD-10-CM | POA: Diagnosis not present

## 2020-10-12 DIAGNOSIS — M9903 Segmental and somatic dysfunction of lumbar region: Secondary | ICD-10-CM | POA: Diagnosis not present

## 2020-10-12 DIAGNOSIS — M5412 Radiculopathy, cervical region: Secondary | ICD-10-CM | POA: Diagnosis not present

## 2020-10-12 LAB — CYTOLOGY - PAP
Comment: NEGATIVE
High risk HPV: NEGATIVE

## 2020-10-15 DIAGNOSIS — M9901 Segmental and somatic dysfunction of cervical region: Secondary | ICD-10-CM | POA: Diagnosis not present

## 2020-10-15 DIAGNOSIS — M9903 Segmental and somatic dysfunction of lumbar region: Secondary | ICD-10-CM | POA: Diagnosis not present

## 2020-10-15 DIAGNOSIS — M9902 Segmental and somatic dysfunction of thoracic region: Secondary | ICD-10-CM | POA: Diagnosis not present

## 2020-10-17 DIAGNOSIS — M9903 Segmental and somatic dysfunction of lumbar region: Secondary | ICD-10-CM | POA: Diagnosis not present

## 2020-10-17 DIAGNOSIS — M9902 Segmental and somatic dysfunction of thoracic region: Secondary | ICD-10-CM | POA: Diagnosis not present

## 2020-10-17 DIAGNOSIS — M9901 Segmental and somatic dysfunction of cervical region: Secondary | ICD-10-CM | POA: Diagnosis not present

## 2020-10-19 IMAGING — MG DIGITAL SCREENING BILATERAL MAMMOGRAM WITH TOMO AND CAD
6 of 10 series · 6 of 30 positions shown · non-contrast
Comparison: Previous exam(s).
COMPARISON: Previous exam(s).

Addendum:
CLINICAL DATA: Screening.

EXAM:
DIGITAL SCREENING BILATERAL MAMMOGRAM WITH TOMO AND CAD

[L CC synth-2D]
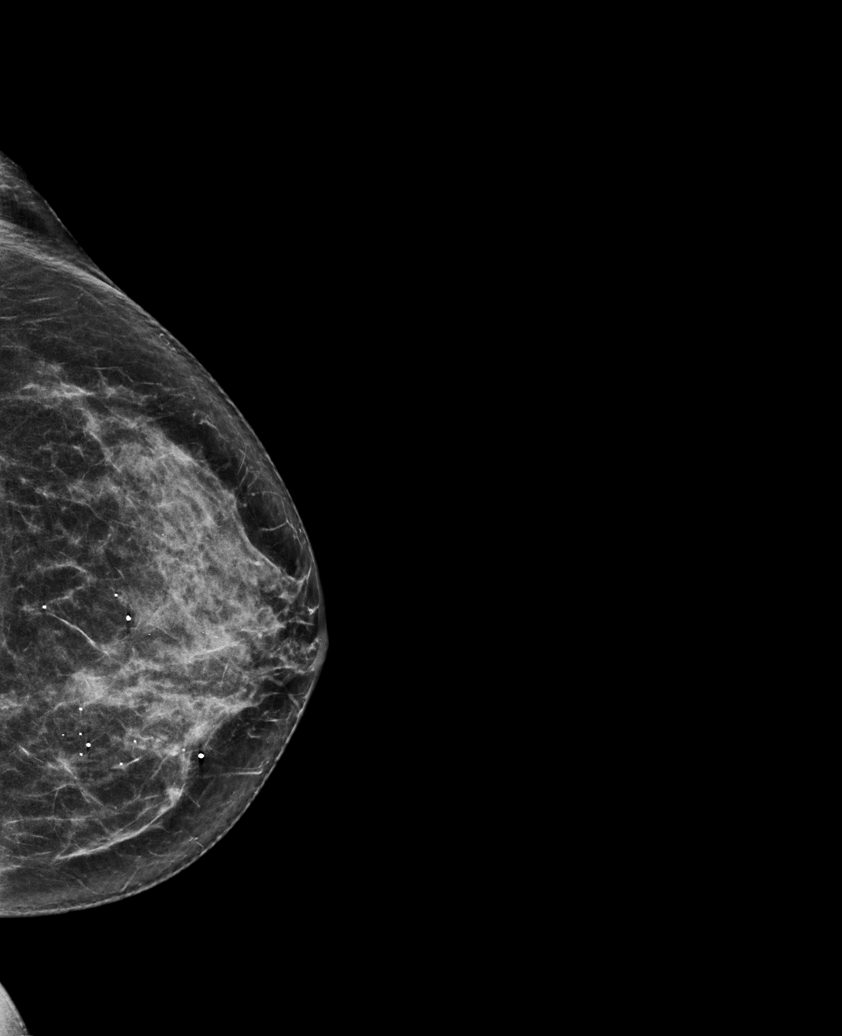

[R MLO synth-2D]
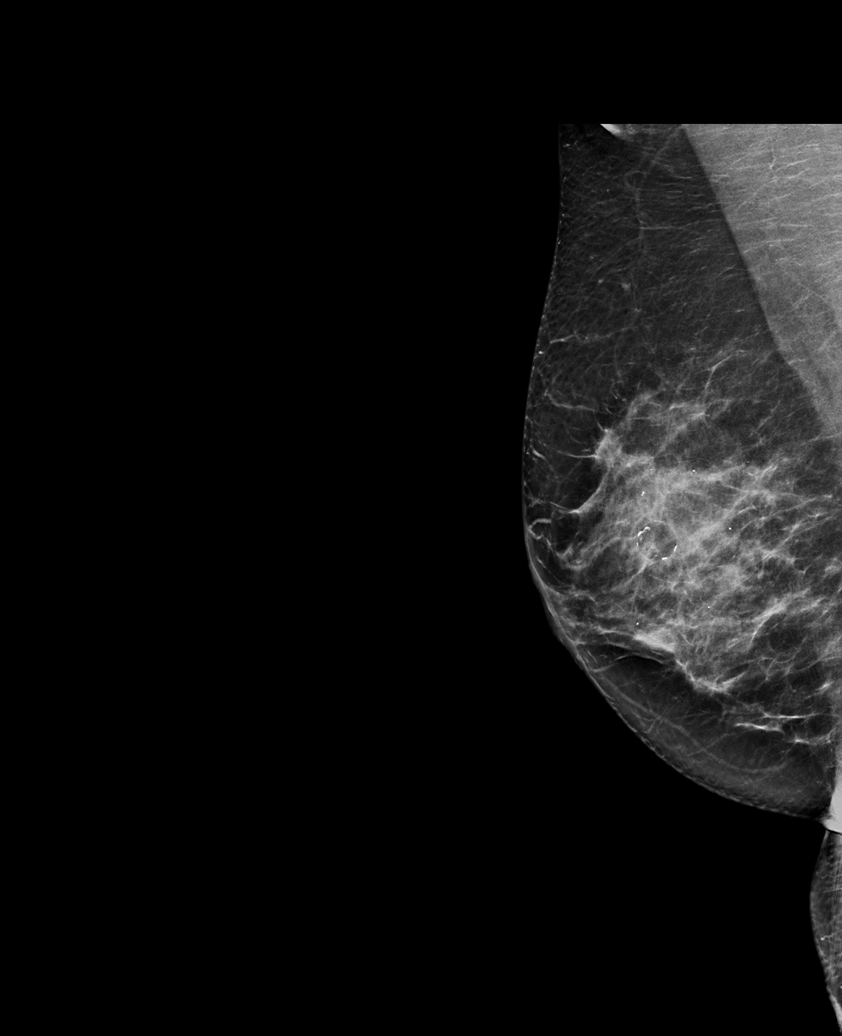

[L MLO synth-2D]
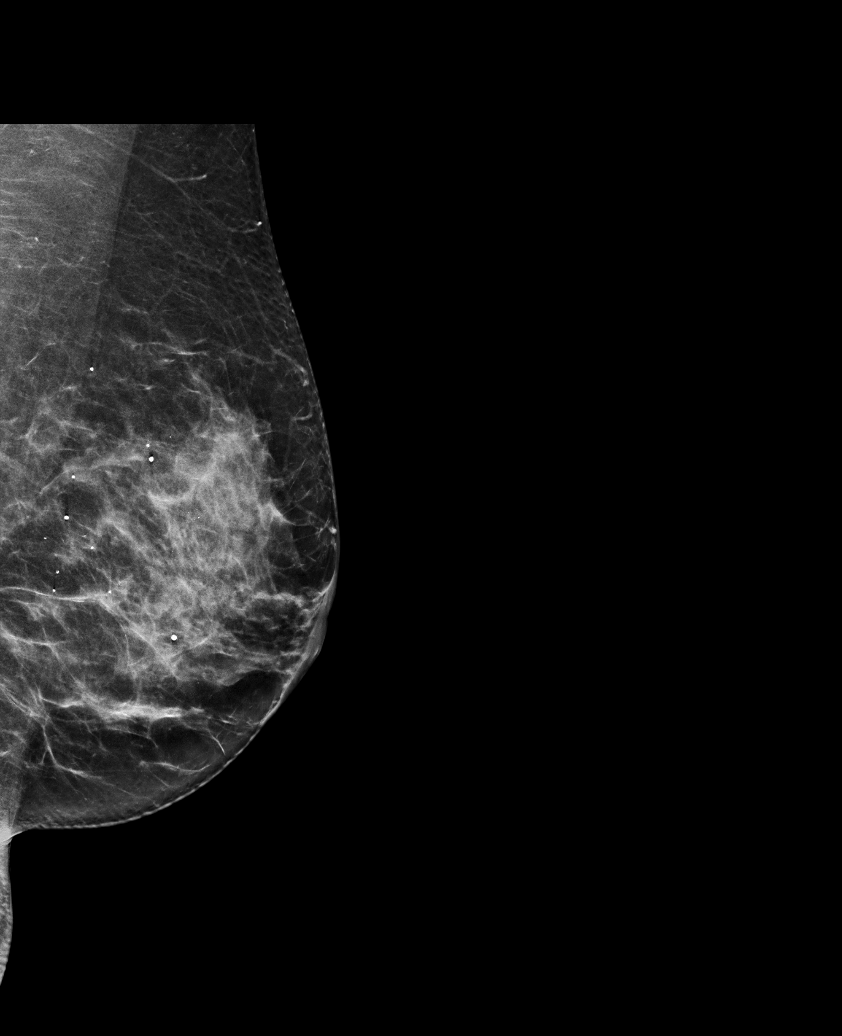

[R CC synth-2D (1 of 2)]
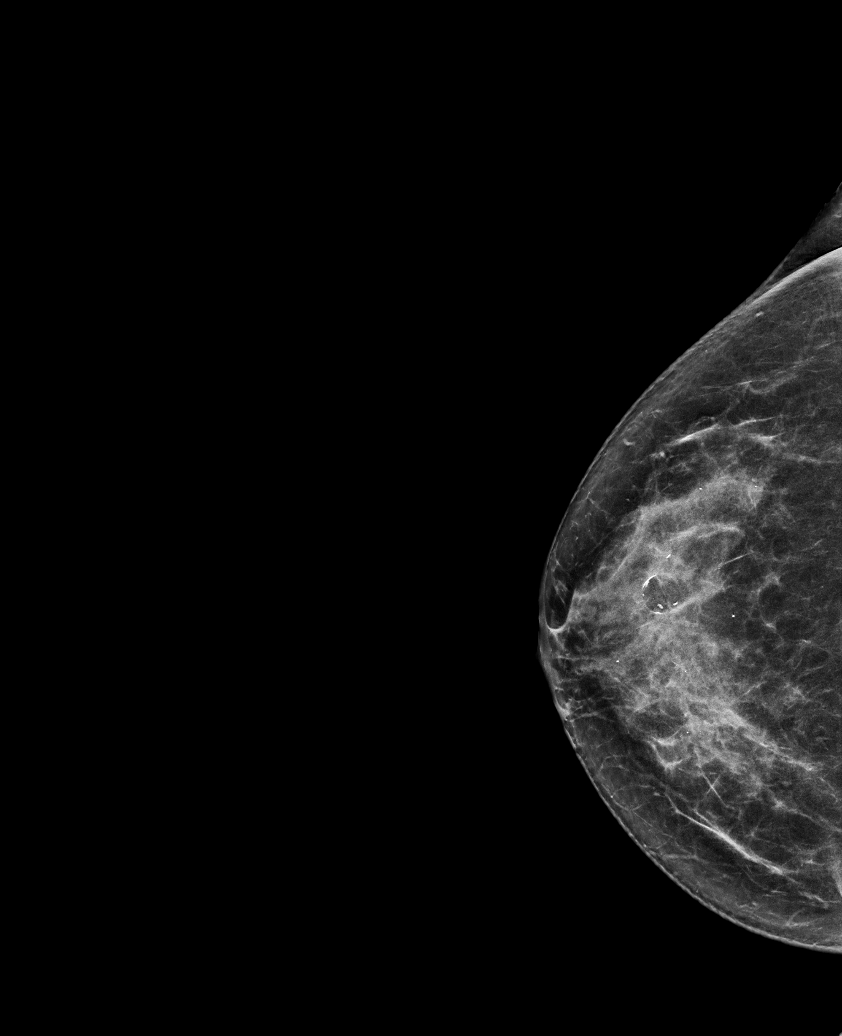

[R CC synth-2D (2 of 2)]
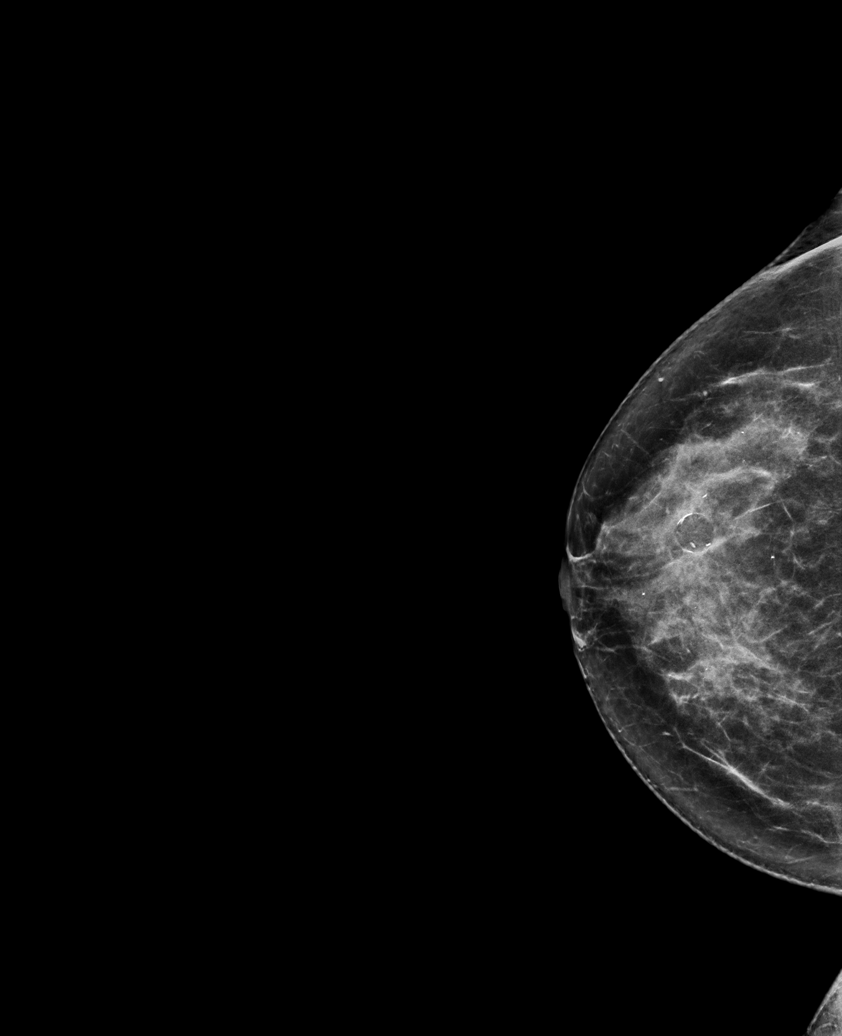

[R CC tomo · tomo slice 38/75.0]
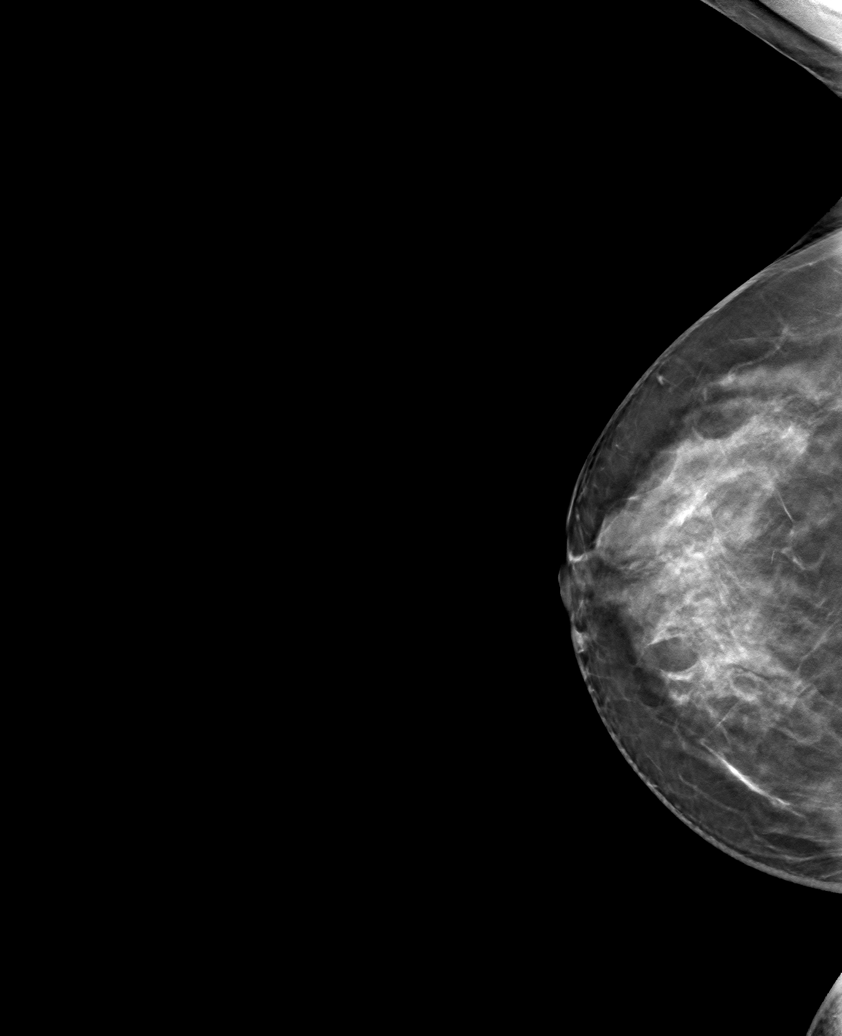

[6 of 30 positions shown; findings below may reference images not displayed]

ACR Breast Density Category c: The breast tissue is heterogeneously
dense, which may obscure small masses.
FINDINGS: In the right breast, a possible asymmetry warrants further
evaluation. In the left breast, no findings suspicious for
malignancy. Images were processed with CAD.
IMPRESSION: Further evaluation is suggested for possible asymmetry in the right
breast.

RECOMMENDATION:
Diagnostic mammogram and possibly ultrasound of the right breast.
(Code:[id number])

The patient will be contacted regarding the findings, and additional
imaging will be scheduled.

BI-RADS CATEGORY  0: Incomplete. Need additional imaging evaluation
and/or prior mammograms for comparison.

ADDENDUM:
A possible asymmetry is also identified in the LEFT breast.

LEFT diagnostic mammogram and LEFT breast ultrasound recommended for
further evaluation.

*** End of Addendum ***
ACR Breast Density Category c: The breast tissue is heterogeneously
dense, which may obscure small masses.
FINDINGS: In the right breast, a possible asymmetry warrants further
evaluation. In the left breast, no findings suspicious for
malignancy. Images were processed with CAD.
IMPRESSION: Further evaluation is suggested for possible asymmetry in the right
breast.

RECOMMENDATION:
Diagnostic mammogram and possibly ultrasound of the right breast.
(Code:[id number])

The patient will be contacted regarding the findings, and additional
imaging will be scheduled.

BI-RADS CATEGORY  0: Incomplete. Need additional imaging evaluation
and/or prior mammograms for comparison.

## 2020-11-09 ENCOUNTER — Other Ambulatory Visit: Payer: Self-pay

## 2020-11-09 ENCOUNTER — Ambulatory Visit
Admission: RE | Admit: 2020-11-09 | Discharge: 2020-11-09 | Disposition: A | Discharge status: Home / Self Care | Payer: PRIVATE HEALTH INSURANCE | Source: Ambulatory Visit | Attending: Family Medicine | Admitting: Family Medicine

## 2020-11-09 DIAGNOSIS — Z1231 Encounter for screening mammogram for malignant neoplasm of breast: Secondary | ICD-10-CM

## 2021-01-08 ENCOUNTER — Other Ambulatory Visit: Payer: Self-pay

## 2021-01-08 ENCOUNTER — Ambulatory Visit (INDEPENDENT_AMBULATORY_CARE_PROVIDER_SITE_OTHER): Payer: Managed Care, Other (non HMO) | Admitting: Family Medicine

## 2021-01-08 VITALS — BP 100/60 | HR 77 | Temp 97.0°F | Ht 60.0 in | Wt 123.2 lb

## 2021-01-08 DIAGNOSIS — Z0000 Encounter for general adult medical examination without abnormal findings: Secondary | ICD-10-CM

## 2021-01-08 DIAGNOSIS — Z1211 Encounter for screening for malignant neoplasm of colon: Secondary | ICD-10-CM

## 2021-01-08 DIAGNOSIS — Z1159 Encounter for screening for other viral diseases: Secondary | ICD-10-CM

## 2021-01-08 DIAGNOSIS — R400 Somnolence: Secondary | ICD-10-CM | POA: Diagnosis not present

## 2021-01-08 DIAGNOSIS — Z23 Encounter for immunization: Secondary | ICD-10-CM

## 2021-01-08 NOTE — Assessment & Plan Note (Signed)
Suspect this is related to change in shift and poor sleep but will get labs to check for other causes. Sleep is improving so will do watch and wait and f/u labs.

## 2021-01-08 NOTE — Patient Instructions (Addendum)
Schedule labs in at least 2 weeks from now (4 weeks from donation of blood)   Can also try melatonin for sleep   Sleep hygiene checklist: 1. Avoid naps during the day 2. Avoid stimulants such as caffeine and nicotine. Avoid bedtime alcohol (it can speed onset of sleep but the body's metabolism can cause awakenings). At least 2 hours before bedtime 3. All forms of exercise help ensure sound sleep - limit vigorous exercise to morning or late afternoon 4. Avoid food too close to bedtime including chocolate (which contains caffeine) 5. Soak up natural light 6. Establish regular bedtime routine. 7. Associate bed with sleep - avoid TV, computer or phone, reading while in bed. 8. Ensure pleasant, relaxing sleep environment - quiet, dark, cool room.  Good Sleep Hygiene Habits -- Got to bed and wake up within an hour of the same time every day -- Avoid bright screens (from laptop, phone, TV) within at least 30 minutes before bed. The "blue light" supresses the sleep hormone melatonin and the content may stimulate as well -- Maintain a quiet and dark sleep environment (blackout curtains, turn on a fan or white noise to block out disruptive sounds) -- Practicing relaxing activites before bed (taking a shower, reading a book, journaling, meditation app) -- To quiet a busy mind -- consider journaling before bed (jotting down reminders, worry thoughts, as well as positive things like a gratitude list)   Begin a Mindfulness/Meditation practice -- this can take a little as 3 minutes -- You can find resources in books -- Or you can download apps like  ---- Headspace App (which currently has free content called "Weathering the Storm") ---- Calm (which has a few free options)  ---- Insignt Timer ---- Stop, Breathe & Think  # With each of these Apps - you should decline the "start free trial" offer and as you search through the App should be able to access some of their free content. You can also chose to  pay for the content if you find one that works well for you.   # Many of them also offer sleep specific content which may help with insomnia

## 2021-01-08 NOTE — Progress Notes (Signed)
Annual Exam   Chief Complaint:  Chief Complaint  Patient presents with   Annual Exam   Numbness    Still having numbness in her hands     History of Present Illness:  [name] is a 45 y.o. [id number] who LMP was No LMP recorded. Patient is premenopausal., presents today for her annual examination.    Sleeping more Tired more  Numbness in her hand Did donate blood 2 weeks ago - so iron should be fine  Insomnia  - was waking up at night and unable to fall asleep - has switched shifts - works 12 pm - 4 pm and 5 pm -10pm - comes home and goes 12 am - 6 am - is sleeping through the night now - will occasionally have some worrying   Numbness in hands - was seeing chiropractor - x-rays done and told she had some pinched nerves in the hands. Insurance changed  Nutrition Diet: eating healthy, high protein, eating every 3 hours Exercise: walking for work all day She does get adequate calcium and Vitamin D in her diet.   Social History   Tobacco Use  Smoking Status Former   Types: Cigarettes   Quit date: 1998   Years since quitting: 24.7  Smokeless Tobacco Never   Social History   Substance and Sexual Activity  Alcohol Use Yes   Comment: weekends, 1-2 glasses   Social History   Substance and Sexual Activity  Drug Use No    Safety The patient wears seatbelts: yes.     The patient feels safe at home and in their relationships: yes.  General Health Dentist in the last year: No - planning to soon Eye doctor: yes  Menstrual Irregular, no cycle x a few months  GYN She is single partner, contraception - condoms most of the time.    Cervical Cancer Screening:   Last Pap:   June 2022 Results were: low-grade squamous intraepithelial neoplasia (LGSIL - encompassing HPV,mild dysplasia,CIN I) / - return in 6 months with OB/GYN   Breast Cancer Screening There is no FH of breast cancer. There is no FH of ovarian cancer. BRCA screening Not Indicated.   Discussed that for average risk women between age 63-49 screening may reduce the risk of breast cancer death, however, at a lower rate than those over age 61. And that the the false-positive rates resulting in unnecessary biopsies with more screening is higher. The balance of benefits vs harms likely improves as you progress through your 40s. The patient does want a mammogram this year.   Colon Cancer Screening:  Age 31-75 yo - benefits outweigh the risk. Adults 3-85 yo who have never been screened benefit.  Benefits: 134000 people in 2016 will be diagnosed and 49,000 will die - early detection helps Harms: Complications 2/2 to colonoscopy High Risk (Colonoscopy): genetic disorder (Lynch syndrome or familial adenomatous polyposis), personal hx of IBD, previous adenomatous polyp, or previous colorectal cancer, FamHx start 10 years before the age at diagnosis, increased in males and black race  Options:  FIT - looks for hemoglobin (blood in the stool) - specific and fairly sensitive - must be done annually Cologuard - looks for DNA and blood - more sensitive - therefore can have more false positives, every 3 years Colonoscopy - every 10 years if normal - sedation, bowl prep, must have someone drive you  Shared decision making and the patient had decided to do colonoscopy.  Weight Wt Readings from Last  3 Encounters:  01/08/21 123 lb 4 oz (55.9 kg)  10/10/20 125 lb (56.7 kg)  07/13/20 122 lb 8 oz (55.6 kg)   Patient has normal BMI  BMI Readings from Last 1 Encounters:  01/08/21 24.07 kg/m     Chronic disease screening Blood pressure monitoring:  BP Readings from Last 3 Encounters:  01/08/21 100/60  10/10/20 118/70  06/05/20 100/64    Lipid Monitoring: Indication for screening: age >28, obesity, diabetes, family hx, CV risk factors.  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 Benign colposcopy biopsies on 04/15/18, repeat cotesting in one year   Hand pain    bilateral    Past Surgical History:  Procedure Laterality Date   GANGLION CYST EXCISION Right    wrist    Prior to Admission medications   Medication Sig Start Date End Date Taking? Authorizing Provider  Cholecalciferol (VITAMIN D3 PO) Take by mouth.   Yes [provider]  Misc Natural Products (TART CHERRY ADVANCED PO) Take by mouth 2 (two) times daily.   Yes [provider]  Multiple Vitamins-Minerals (WOMENS MULTIVITAMIN PO) Take by mouth.   Yes [provider]  TURMERIC-GINGER PO Take by mouth.   Yes [provider]  valACYclovir (VALTREX) 1000 MG tablet Take one tab po daily beginning at first signs of outbreak. Take for 5 days. 08/04/19  Yes Princess Bruins, MD    No Known Allergies  Gynecologic History: No LMP recorded. Patient is premenopausal.  Obstetric History: [id number]  Social History   Socioeconomic History   Marital status: Divorced    Spouse name: Not on file   Number of children: 2   Years of education: High school   Highest education level: Not on file  Occupational History   Occupation: Designer, jewellery  Tobacco Use   Smoking status: Former    Types: Cigarettes    Quit date: 1998    Years since quitting: 24.7   Smokeless tobacco: Never  Vaping Use   Vaping Use: Never used  Substance and Sexual Activity   Alcohol use: Yes    Comment: weekends, 1-2 glasses   Drug use: No    Sexual activity: Yes    Partners: Male    Birth control/protection: Condom    Comment: 1st intercourse- 13, partners- ?, married- 55 yrs  Other Topics Concern   Not on file  Social History Narrative   10/04/19   From: From [place], moved in 2005   Living: living with husband (currently going through divorce) and son [name])   Work: Presenter, broadcasting and trucks      Family: has friends nearby, family is not Radiation protection practitioner, Daughter - Arts development officer (1997) and [name] (2007)      Enjoys: gym, walking, exercise      Exercise: gym and exercise   Diet: eats small meals every 2 hours, protein supplements  Safety   Seat belts: Yes    Guns: Yes  and secure   Safe in relationships: Yes       1-2 cups caffeine daily.   Right-handed.   Social Determinants of Health   Financial Resource Strain: Not on file  Food Insecurity: Not on file  Transportation Needs: Not on file  Physical Activity: Not on file  Stress: Not on file  Social Connections: Not on file  Intimate Partner Violence: Not on file    Family History  Problem Relation Age of Onset   Brain cancer Paternal Uncle    Other Mother        rabies from dog bite   Arthritis Father    Heart attack Neg Hx    Stroke Neg Hx     Review of Systems  Constitutional:  Negative for chills and fever.  HENT:  Negative for congestion and sore throat.   Eyes:  Negative for blurred vision and double vision.  Respiratory:  Negative for shortness of breath.   Cardiovascular:  Negative for chest pain.  Gastrointestinal:  Negative for heartburn, nausea and vomiting.  Genitourinary: Negative.   Musculoskeletal: Negative.  Negative for myalgias.  Skin:  Negative for rash.  Neurological:  Negative for dizziness and headaches.  Endo/Heme/Allergies:  Does not bruise/bleed easily.  Psychiatric/Behavioral:  Negative for depression. The patient has insomnia. The patient is not nervous/anxious.     Physical Exam BP 100/60   Pulse 77   Temp (!) 97 F  (36.1 C) (Temporal)   Ht 5' (1.524 m)   Wt 123 lb 4 oz (55.9 kg)   SpO2 99%   BMI 24.07 kg/m    BP Readings from Last 3 Encounters:  01/08/21 100/60  10/10/20 118/70  06/05/20 100/64      Physical Exam Constitutional:      General: She is not in acute distress.    Appearance: She is well-developed. She is not diaphoretic.  HENT:     Head: Normocephalic and atraumatic.     Right Ear: External ear normal.     Left Ear: External ear normal.     Nose: Nose normal.  Eyes:     General: No scleral icterus.    Extraocular Movements: Extraocular movements intact.     Conjunctiva/sclera: Conjunctivae normal.  Cardiovascular:     Rate and Rhythm: Normal rate and regular rhythm.     Heart sounds: No murmur heard. Pulmonary:     Effort: Pulmonary effort is normal. No respiratory distress.     Breath sounds: Normal breath sounds. No wheezing.  Abdominal:     General: Bowel sounds are normal. There is no distension.     Palpations: Abdomen is soft. There is no mass.     Tenderness: There is no abdominal tenderness. There is no guarding or rebound.  Musculoskeletal:        General: Normal range of motion.     Cervical back: Neck supple.  Lymphadenopathy:     Cervical: No cervical adenopathy.  Skin:    General: Skin is warm and dry.     Capillary Refill: Capillary refill takes less than 2 seconds.  Neurological:     Mental Status: She is alert and oriented to person, place, and time.     Deep Tendon Reflexes: Reflexes normal.  Psychiatric:        Mood and Affect: Mood normal.        Behavior: Behavior normal.     Results:  PHQ-9:  [place] Visit from 03/28/2019 in [place]  PHQ-9 Total Score 2         Assessment: 45 y.o. [phone] female here for routine annual physical examination.  Plan: Problem List Items Addressed This Visit       Other   Daytime sleepiness    Suspect this is related to change in shift and poor sleep but  will get labs to check for other causes. Sleep is improving so will do watch and wait and f/u labs.       Relevant Orders   CBC   TSH   Ferritin   Other Visit Diagnoses     Annual physical exam    -  Primary   Relevant Orders   Comprehensive metabolic panel   Lipid panel   Need for influenza vaccination       Relevant Orders   Flu Vaccine QUAD 71moIM (Fluarix, Fluzone & Alfiuria Quad PF) (Completed)   Screening for colon cancer       Relevant Orders   Ambulatory referral to Gastroenterology   Need for hepatitis C screening test       Relevant Orders   Hepatitis C antibody       Screening: -- Blood pressure screen normal -- cholesterol screening: will obtain -- Weight screening: normal -- Diabetes Screening: will obtain -- Nutrition: Encouraged healthy diet  The 10-year ASCVD risk score (Arnett DK, et al., 2019) is: 0.5%   Values used to calculate the score:     Age: [age]years     Sex: Female     Is Non-Hispanic African American: No     Diabetic: No     Tobacco smoker: No     Systolic Blood Pressure: 1472mmHg     Is BP treated: No     HDL Cholesterol: 67.3 mg/dL     Total Cholesterol: 226 mg/dL  -- Statin therapy for Age 46-75with CVD risk >7.5%  Psych -- Depression screening (PHQ-9):  [place] [facility] [place] PH[facility]-[place] Total Score 2        Safety -- tobacco screening: not using -- alcohol screening:  low-risk usage. -- no evidence of domestic violence or intimate partner violence.   Cancer Screening -- pap smear not collected per ASCCP guidelines -- family history of breast cancer screening: done. not at high risk. -- Mammogram -  up to date -- Colon cancer (age 46+-- ordered  Immunizations Immunization History  Administered Date(s) Administered   Influenza,inj,Quad PF,6+ Mos 01/10/2015, 01/16/2016, 04/15/2018, 01/21/2019, 01/08/2021   PFIZER(Purple Top)SARS-COV-2 Vaccination 08/12/2019,  09/02/2019   Tdap 03/28/2019    -- flu vaccine up to date -- TDAP q10 years up to date -- Covid-19 Vaccine not up to date - looking into getting the omicron vaccine   Encouraged healthy diet and exercise. Encouraged regular vision and dental care.   JLesleigh Noe MD

## 2021-01-22 ENCOUNTER — Other Ambulatory Visit (INDEPENDENT_AMBULATORY_CARE_PROVIDER_SITE_OTHER): Payer: Managed Care, Other (non HMO)

## 2021-01-22 ENCOUNTER — Other Ambulatory Visit: Payer: Self-pay

## 2021-01-22 DIAGNOSIS — Z0000 Encounter for general adult medical examination without abnormal findings: Secondary | ICD-10-CM | POA: Diagnosis not present

## 2021-01-22 DIAGNOSIS — Z1159 Encounter for screening for other viral diseases: Secondary | ICD-10-CM | POA: Diagnosis not present

## 2021-01-22 DIAGNOSIS — R400 Somnolence: Secondary | ICD-10-CM

## 2021-01-22 LAB — LIPID PANEL
Cholesterol: 226 mg/dL — ABNORMAL HIGH (ref 0–200)
HDL: 61.7 mg/dL (ref >39.00)
LDL Cholesterol: 142 mg/dL — ABNORMAL HIGH (ref 0–99)
NonHDL: 164.72
Total CHOL/HDL Ratio: 4
Triglycerides: 114 mg/dL (ref 0.0–149.0)
VLDL: 22.8 mg/dL (ref 0.0–40.0)

## 2021-01-22 LAB — COMPREHENSIVE METABOLIC PANEL
ALT: 15 U/L (ref 0–35)
AST: 18 U/L (ref 0–37)
Albumin: 4.4 g/dL (ref 3.5–5.2)
Alkaline Phosphatase: 112 U/L (ref 39–117)
BUN: 15 mg/dL (ref 6–23)
CO2: 28 mEq/L (ref 19–32)
Calcium: 9.4 mg/dL (ref 8.4–10.5)
Chloride: 103 mEq/L (ref 96–112)
Creatinine, Ser: 0.56 mg/dL (ref 0.40–1.20)
GFR: 109.65 mL/min (ref >60.00)
Glucose, Bld: 83 mg/dL (ref 70–99)
Potassium: 4.1 mEq/L (ref 3.5–5.1)
Sodium: 139 mEq/L (ref 135–145)
Total Bilirubin: 0.7 mg/dL (ref 0.2–1.2)
Total Protein: 6.9 g/dL (ref 6.0–8.3)

## 2021-01-22 LAB — TSH: TSH: 0.79 u[IU]/mL (ref 0.35–5.50)

## 2021-01-22 LAB — FERRITIN: Ferritin: 5 ng/mL — ABNORMAL LOW (ref 10.0–291.0)

## 2021-01-22 LAB — CBC
HCT: 36.2 % (ref 36.0–46.0)
Hemoglobin: 11.7 g/dL — ABNORMAL LOW (ref 12.0–15.0)
MCHC: 32.2 g/dL (ref 30.0–36.0)
MCV: 85.1 fl (ref 78.0–100.0)
Platelets: 394 10*3/uL (ref 150.0–400.0)
RBC: 4.26 Mil/uL (ref 3.87–5.11)
RDW: 15.8 % — ABNORMAL HIGH (ref 11.5–15.5)
WBC: 4.4 10*3/uL (ref 4.0–10.5)

## 2021-01-23 LAB — HEPATITIS C ANTIBODY
Hepatitis C Ab: NONREACTIVE
SIGNAL TO CUT-OFF: 0.01 (ref <1.00)

## 2021-05-16 ENCOUNTER — Other Ambulatory Visit: Payer: Self-pay

## 2021-05-16 DIAGNOSIS — Z1211 Encounter for screening for malignant neoplasm of colon: Secondary | ICD-10-CM

## 2021-05-16 MED ORDER — NA SULFATE-K SULFATE-MG SULF 17.5-3.13-1.6 GM/177ML PO SOLN: 1.0000 | Freq: Once | ORAL | 0 refills | Status: AC

## 2021-05-16 NOTE — Progress Notes (Unsigned)
Gastroenterology Pre-Procedure Review  Request Date: 05/27/2021 Requesting Physician: Dr. Servando Snare  PATIENT REVIEW QUESTIONS: The patient responded to the following health history questions as indicated:    1. Are you having any GI issues? no 2. Do you have a personal history of Polyps? no 3. Do you have a family history of Colon Cancer or Polyps? no 4. Diabetes Mellitus? no 5. Joint replacements in the past 12 months?no 6. Major health problems in the past 3 months?no 7. Any artificial heart valves, MVP, or defibrillator?no    MEDICATIONS & ALLERGIES:    Patient reports the following regarding taking any anticoagulation/antiplatelet therapy:   Plavix, Coumadin, Eliquis, Xarelto, Lovenox, Pradaxa, Brilinta, or Effient? no Aspirin? no  Patient confirms/reports the following medications:  Current Outpatient Medications  Medication Sig Dispense Refill   Cholecalciferol (VITAMIN D3 PO) Take by mouth.     Misc Natural Products (TART CHERRY ADVANCED PO) Take by mouth 2 (two) times daily.     Multiple Vitamins-Minerals (WOMENS MULTIVITAMIN PO) Take by mouth.     TURMERIC-GINGER PO Take by mouth.     valACYclovir (VALTREX) 1000 MG tablet Take one tab po daily beginning at first signs of outbreak. Take for 5 days. 15 tablet 2   No current facility-administered medications for this visit.    Patient confirms/reports the following allergies:  No Known Allergies  No orders of the defined types were placed in this encounter.   AUTHORIZATION INFORMATION Primary Insurance: 1D#: Group #:  Secondary Insurance: 1D#: Group #:  SCHEDULE INFORMATION: Date: 05/27/2021 Time: Location:[facility]

## 2021-05-20 ENCOUNTER — Encounter: Payer: Self-pay | Admitting: Gastroenterology

## 2021-05-27 ENCOUNTER — Ambulatory Visit: Payer: Managed Care, Other (non HMO) | Admitting: Anesthesiology

## 2021-05-27 ENCOUNTER — Other Ambulatory Visit: Payer: Self-pay

## 2021-05-27 ENCOUNTER — Encounter
Admission: RE | Disposition: A | Discharge status: Home / Self Care | Payer: Self-pay | Source: Home / Self Care | Attending: Gastroenterology

## 2021-05-27 ENCOUNTER — Encounter: Payer: Self-pay | Admitting: Gastroenterology

## 2021-05-27 ENCOUNTER — Ambulatory Visit
Admission: RE | Admit: 2021-05-27 | Discharge: 2021-05-27 | Disposition: A | Discharge status: Home / Self Care | Payer: Managed Care, Other (non HMO) | Attending: Gastroenterology | Admitting: Gastroenterology

## 2021-05-27 DIAGNOSIS — Z1211 Encounter for screening for malignant neoplasm of colon: Secondary | ICD-10-CM | POA: Diagnosis not present

## 2021-05-27 DIAGNOSIS — K640 First degree hemorrhoids: Secondary | ICD-10-CM | POA: Diagnosis not present

## 2021-05-27 DIAGNOSIS — Z87891 Personal history of nicotine dependence: Secondary | ICD-10-CM | POA: Diagnosis not present

## 2021-05-27 HISTORY — PX: COLONOSCOPY WITH PROPOFOL: SHX5780

## 2021-05-27 LAB — POCT PREGNANCY, URINE: Preg Test, Ur: NEGATIVE

## 2021-05-27 SURGERY — COLONOSCOPY WITH PROPOFOL
Anesthesia: General | Site: Rectum

## 2021-05-27 MED ORDER — ACETAMINOPHEN 160 MG/5ML PO SOLN: 325.0000 mg | Freq: Once | ORAL | Status: DC

## 2021-05-27 MED ORDER — LACTATED RINGERS IV SOLN: INTRAVENOUS | Status: DC

## 2021-05-27 MED ORDER — STERILE WATER FOR IRRIGATION IR SOLN
Status: DC | PRN
  Administered 2021-05-27: 1

## 2021-05-27 MED ORDER — PROPOFOL 10 MG/ML IV BOLUS
INTRAVENOUS | Status: DC | PRN
  Administered 2021-05-27 (×2): 50 mg via INTRAVENOUS
  Administered 2021-05-27 (×2): 100 mg via INTRAVENOUS
  Administered 2021-05-27: 50 mg via INTRAVENOUS

## 2021-05-27 MED ORDER — SODIUM CHLORIDE 0.9 % IV SOLN: INTRAVENOUS | Status: DC

## 2021-05-27 MED ORDER — STERILE WATER FOR IRRIGATION IR SOLN
Status: DC | PRN
  Administered 2021-05-27: 60 mL

## 2021-05-27 MED ORDER — LIDOCAINE HCL (CARDIAC) PF 100 MG/5ML IV SOSY
PREFILLED_SYRINGE | INTRAVENOUS | Status: DC | PRN
  Administered 2021-05-27: 30 mg via INTRAVENOUS

## 2021-05-27 MED ORDER — ACETAMINOPHEN 325 MG PO TABS: 325.0000 mg | ORAL_TABLET | Freq: Once | ORAL | Status: DC

## 2021-05-27 SURGICAL SUPPLY — 6 items
GOWN CVR UNV OPN BCK APRN NK (MISCELLANEOUS) ×2 IMPLANT
GOWN ISOL THUMB LOOP REG UNIV (MISCELLANEOUS) ×4
KIT PRC NS LF DISP ENDO (KITS) ×1 IMPLANT
KIT PROCEDURE OLYMPUS (KITS) ×2
MANIFOLD NEPTUNE II (INSTRUMENTS) ×2 IMPLANT
WATER STERILE IRR 250ML POUR (IV SOLUTION) ×2 IMPLANT

## 2021-05-27 NOTE — Op Note (Signed)
[facility] Gastroenterology Patient Name: Sabrina Randall Procedure Date: 05/27/2021 8:32 AM MRN: [id number] Account #: [id number] Date of Birth: [birth year] Admit Type: Outpatient Age: 46 Room: [facility] OR ROOM 01 Gender: Female Note Status: Finalized Instrument Name: [id number] Procedure:             Colonoscopy Indications:           Screening for colorectal malignant neoplasm Providers:             Midge Minium MD, MD Referring MD:          Chryl Heck. Selena Batten (Referring MD) Medicines:             Propofol per Anesthesia Complications:         No immediate complications. Procedure:             Pre-Anesthesia Assessment:                        - Prior to the procedure, a History and Physical was                         performed, and patient medications and allergies were                         reviewed. The patient's tolerance of previous                         anesthesia was also reviewed. The risks and benefits                         of the procedure and the sedation options and risks                         were discussed with the patient. All questions were                         answered, and informed consent was obtained. Prior                         Anticoagulants: The patient has taken no previous                         anticoagulant or antiplatelet agents. ASA Grade                         Assessment: II - A patient with mild systemic disease.                         After reviewing the risks and benefits, the patient                         was deemed in satisfactory condition to undergo the                         procedure.                        After obtaining informed consent, the colonoscope was  passed under direct vision. Throughout the procedure,                         the patient's blood pressure, pulse, and oxygen                         saturations were monitored continuously. The                         Colonoscope  was introduced through the anus and                         advanced to the the cecum, identified by appendiceal                         orifice and ileocecal valve. The colonoscopy was                         performed without difficulty. The patient tolerated                         the procedure well. The quality of the bowel                         preparation was excellent. Findings:      The perianal and digital rectal examinations were normal.      Non-bleeding internal hemorrhoids were found during retroflexion. The       hemorrhoids were Grade I (internal hemorrhoids that do not prolapse).      The exam was otherwise without abnormality. Impression:            - Non-bleeding internal hemorrhoids.                        - The examination was otherwise normal.                        - No specimens collected. Recommendation:        - Discharge patient to home.                        - Resume previous diet.                        - Continue present medications.                        - Repeat colonoscopy in 10 years for screening                         purposes. Procedure Code(s):     --- Professional ---                        [phone], Colonoscopy, flexible; diagnostic, including                         collection of specimen(s) by brushing or washing, when                         performed (separate procedure) Diagnosis Code(s):     ---  Professional ---                        Z12.11, Encounter for screening for malignant neoplasm                         of colon CPT copyright 2019 American Medical Association. All rights reserved. The codes documented in this report are preliminary and upon coder review may  be revised to meet current compliance requirements. Midge Minium MD, MD 05/27/2021 10:18:40 AM This report has been signed electronically. Number of Addenda: 0 Note Initiated On: 05/27/2021 8:32 AM Scope Withdrawal Time: 0 hours 7 minutes 4 seconds  Total Procedure Duration: 0  hours 10 minutes 25 seconds  Estimated Blood Loss:  Estimated blood loss: none.      [facility]

## 2021-05-27 NOTE — Anesthesia Preprocedure Evaluation (Signed)
Anesthesia Evaluation  Patient identified by MRN, date of birth, ID band Patient awake    Reviewed: Allergy & Precautions, H&P , NPO status , Patient's Chart, lab work & pertinent test results  Airway Mallampati: II  TM Distance: >3 FB Neck ROM: full    Dental no notable dental hx.    Pulmonary former smoker,    Pulmonary exam normal breath sounds clear to auscultation       Cardiovascular Normal cardiovascular exam Rhythm:regular Rate:Normal     Neuro/Psych    GI/Hepatic   Endo/Other    Renal/GU      Musculoskeletal   Abdominal   Peds  Hematology   Anesthesia Other Findings   Reproductive/Obstetrics                             Anesthesia Physical Anesthesia Plan  ASA: 2  Anesthesia Plan: General   Post-op Pain Management: Minimal or no pain anticipated   Induction: Intravenous  PONV Risk Score and Plan: 3 and Treatment may vary due to age or medical condition, Propofol infusion and TIVA  Airway Management Planned: Natural Airway  Additional Equipment:   Intra-op Plan:   Post-operative Plan:   Informed Consent: I have reviewed the patients History and Physical, chart, labs and discussed the procedure including the risks, benefits and alternatives for the proposed anesthesia with the patient or authorized representative who has indicated his/her understanding and acceptance.     Dental Advisory Given  Plan Discussed with: CRNA  Anesthesia Plan Comments:         Anesthesia Quick Evaluation

## 2021-05-27 NOTE — Anesthesia Postprocedure Evaluation (Signed)
Anesthesia Post Note  Patient: Sabrina Randall  Procedure(s) Performed: COLONOSCOPY WITH PROPOFOL (Rectum)     Patient location during evaluation: PACU Anesthesia Type: General Level of consciousness: awake and alert and oriented Pain management: satisfactory to patient Vital Signs Assessment: post-procedure vital signs reviewed and stable Respiratory status: spontaneous breathing, nonlabored ventilation and respiratory function stable Cardiovascular status: blood pressure returned to baseline and stable Postop Assessment: Adequate PO intake and No signs of nausea or vomiting Anesthetic complications: no   No notable events documented.  [place]

## 2021-05-27 NOTE — Anesthesia Procedure Notes (Signed)
Procedure Name: MAC Date/Time: 05/27/2021 10:09 AM Performed by: Georga Bora, CRNA Pre-anesthesia Checklist: Patient identified, Emergency Drugs available, Suction available, Patient being monitored and Timeout performed Patient Re-evaluated:Patient Re-evaluated prior to induction Oxygen Delivery Method: Nasal cannula Induction Type: IV induction Placement Confirmation: positive ETCO2 and breath sounds checked- equal and bilateral

## 2021-05-27 NOTE — Transfer of Care (Signed)
Immediate Anesthesia Transfer of Care Note  Patient: Sabrina Randall  Procedure(s) Performed: COLONOSCOPY WITH PROPOFOL (Rectum)  Patient Location: PACU  Anesthesia Type: General  Level of Consciousness: awake, alert  and patient cooperative  Airway and Oxygen Therapy: Patient Spontanous Breathing and Patient connected to supplemental oxygen  Post-op Assessment: Post-op Vital signs reviewed, Patient's Cardiovascular Status Stable, Respiratory Function Stable, Patent Airway and No signs of Nausea or vomiting  Post-op Vital Signs: Reviewed and stable  Complications: No notable events documented.

## 2021-05-27 NOTE — H&P (Signed)
Sabrina Lame, MD [facility] [address], [place], [place] [address] Phone: [phone] Fax : [phone]  Primary Care Physician:  Sabrina Noe, MD Primary Gastroenterologist:  Dr. Allen Randall  Pre-Procedure History & Physical: HPI:  Sabrina Randall is a 46 y.o. female is here for a screening colonoscopy.   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 Benign colposcopy biopsies on 04/15/18, repeat cotesting in one year   Hand pain    bilateral    Past Surgical History:  Procedure Laterality Date   GANGLION CYST EXCISION Right    wrist    Prior to Admission medications   Medication Sig Start Date End Date Taking? Authorizing Provider  Cholecalciferol (VITAMIN D3 PO) Take by mouth.   Yes [provider]  Misc Natural Products (TART CHERRY ADVANCED PO) Take by mouth 2 (two) times daily.   Yes [provider]  Multiple Vitamins-Minerals (WOMENS MULTIVITAMIN PO) Take by mouth.   Yes [provider]  Omega-3 Fatty Acids (FISH OIL PO) Take by mouth.   Yes [provider]  TURMERIC-GINGER PO Take by mouth.   Yes [provider]  valACYclovir (VALTREX) 1000 MG tablet Take one tab po daily beginning at first signs of outbreak. Take for 5 days. 08/04/19  Yes Princess Bruins, MD    Allergies as of 05/16/2021   (No Known Allergies)    Family History  Problem Relation Age of Onset   Brain cancer Paternal Uncle    Other Mother        rabies from dog bite   Arthritis Father    Heart attack Neg Hx    Stroke Neg Hx     Social History   Socioeconomic History   Marital status: Divorced    Spouse name: Not on file   Number of children: 2   Years of education: High school   Highest education level: Not on file  Occupational History   Occupation: Designer, jewellery   Tobacco Use   Smoking status: Former    Types: Cigarettes    Quit date: 1998    Years since quitting: 25.1   Smokeless tobacco: Never  Vaping Use   Vaping Use: Never used  Substance and Sexual Activity   Alcohol use: Yes    Comment: weekends, 1-2 glasses   Drug use: No   Sexual activity: Yes    Partners: Male    Birth control/protection: Condom    Comment: 1st intercourse- 13, partners- ?, married- 40 yrs  Other Topics Concern   Not on file  Social History Narrative   10/04/19   From: From [place], moved in 2005   Living: living with husband (currently going through divorce) and son Sabrina Randall)   Work: Presenter, broadcasting and trucks      Family: has friends nearby, family is not Radiation protection practitioner, Daughter - Arts development officer (1997) and Sabrina Randall (2007)      Enjoys: gym, walking, exercise      Exercise: gym and exercise   Diet: eats small meals every 2 hours, protein supplements      Safety   Seat belts: Yes    Guns: Yes  and secure   Safe in relationships: Yes       1-2 cups caffeine daily.   Right-handed.   Social Determinants of Health  Financial Resource Strain: Not on file  Food Insecurity: Not on file  Transportation Needs: Not on file  Physical Activity: Not on file  Stress: Not on file  Social Connections: Not on file  Intimate Partner Violence: Not on file    Review of Systems: See HPI, otherwise negative ROS  Physical Exam: Ht 5' (1.524 m)    Wt 55.8 kg    LMP 11/19/2020 (Approximate)    BMI 24.02 kg/m  General:   Alert,  pleasant and cooperative in NAD Head:  Normocephalic and atraumatic. Neck:  Supple; no masses or thyromegaly. Lungs:  Clear throughout to auscultation.    Heart:  Regular rate and rhythm. Abdomen:  Soft, nontender and nondistended. Normal bowel sounds, without guarding, and without rebound.   Neurologic:  Alert and  oriented x4;  grossly normal neurologically.  Impression/Plan: Sabrina Randall is now here to undergo a screening  colonoscopy.  Risks, benefits, and alternatives regarding colonoscopy have been reviewed with the patient.  Questions have been answered.  All parties agreeable.

## 2021-05-28 ENCOUNTER — Encounter: Payer: Self-pay | Admitting: Gastroenterology

## 2021-08-14 ENCOUNTER — Ambulatory Visit: Payer: Managed Care, Other (non HMO) | Admitting: Family Medicine

## 2021-08-14 ENCOUNTER — Telehealth: Payer: Self-pay

## 2021-08-14 NOTE — Telephone Encounter (Signed)
noted 

## 2021-08-14 NOTE — Telephone Encounter (Signed)
[facility] - Client ?Nonclinical Telephone Record  ?[facility]? ?Client [facility] - Client ?Client Site [place] ?Provider AA - PHYSICIAN, UNKNOWN- MD ?Contact Type Call ?Who Is Calling Patient / Member / Family / Caregiver ?Caller Name Jamilet Darley ?Caller Phone Number [phone] ?Patient Name Sabrina Randall ?Patient DOB 12/08/1975 ?Call Type Message Only Information Provided ?Reason for Call Request to [facility] Appointment ?Initial Comment Caller states she has an appt at 8 AM and she needs to cancel. She has poison ivy but it is ?under control. ?Patient request to speak to RN No ?Additional Comment Caller declined triage. Office hours provided and advised to follow up with the office. ?Disp. Time Disposition Final User ?08/14/2021 6:03:50 AM General Information Provided Yes Gaspar Skeeters ?Call Closed By: Gaspar Skeeters ?Transaction Date/Time: 08/14/2021 6:00:57 AM (ET ?

## 2021-09-30 ENCOUNTER — Other Ambulatory Visit: Payer: Self-pay | Admitting: Family Medicine

## 2021-09-30 DIAGNOSIS — Z1231 Encounter for screening mammogram for malignant neoplasm of breast: Secondary | ICD-10-CM

## 2021-10-24 ENCOUNTER — Ambulatory Visit: Payer: BC Managed Care – PPO | Admitting: Obstetrics & Gynecology

## 2021-11-11 ENCOUNTER — Ambulatory Visit
Admission: RE | Admit: 2021-11-11 | Discharge: 2021-11-11 | Disposition: A | Discharge status: Home / Self Care | Payer: Managed Care, Other (non HMO) | Source: Ambulatory Visit | Attending: Family Medicine | Admitting: Family Medicine

## 2021-11-11 DIAGNOSIS — Z1231 Encounter for screening mammogram for malignant neoplasm of breast: Secondary | ICD-10-CM

## 2021-11-26 ENCOUNTER — Other Ambulatory Visit (HOSPITAL_COMMUNITY)
Admission: RE | Admit: 2021-11-26 | Discharge: 2021-11-26 | Disposition: A | Discharge status: Home / Self Care | Payer: Managed Care, Other (non HMO) | Source: Ambulatory Visit | Attending: Obstetrics & Gynecology | Admitting: Obstetrics & Gynecology

## 2021-11-26 ENCOUNTER — Ambulatory Visit (INDEPENDENT_AMBULATORY_CARE_PROVIDER_SITE_OTHER): Payer: Managed Care, Other (non HMO) | Admitting: Obstetrics & Gynecology

## 2021-11-26 ENCOUNTER — Encounter: Payer: Self-pay | Admitting: Obstetrics & Gynecology

## 2021-11-26 VITALS — BP 110/64 | HR 85 | Ht 59.75 in | Wt 129.0 lb

## 2021-11-26 DIAGNOSIS — N870 Mild cervical dysplasia: Secondary | ICD-10-CM

## 2021-11-26 DIAGNOSIS — Z789 Other specified health status: Secondary | ICD-10-CM

## 2021-11-26 DIAGNOSIS — Z01419 Encounter for gynecological examination (general) (routine) without abnormal findings: Secondary | ICD-10-CM

## 2021-11-26 DIAGNOSIS — R87810 Cervical high risk human papillomavirus (HPV) DNA test positive: Secondary | ICD-10-CM | POA: Diagnosis not present

## 2021-11-26 DIAGNOSIS — Z8619 Personal history of other infectious and parasitic diseases: Secondary | ICD-10-CM

## 2021-11-26 DIAGNOSIS — Z780 Asymptomatic menopausal state: Secondary | ICD-10-CM

## 2021-11-26 MED ORDER — VALACYCLOVIR HCL 1 G PO TABS: ORAL_TABLET | ORAL | 3 refills | Status: DC

## 2021-11-26 NOTE — Progress Notes (Signed)
Sabrina Randall 18-Nov-1975 [id number]   History:    46 y.o. G2P2L2 Married   RP: Established patient presenting for annual gyn exam   HPI: H/O HPV 16 positive in 2019.  Last Colpo 05/2020 CIN 1.  Pap 09/2020 LGSIL.  Pap/HPV HR today.  Postmenopausal, well on no HRT. Hot flushes tolerable. Using condoms.  Had a condom break 2 months ago and took the Morning After Pill which brought a very light spotting.  2 weeks later her HPT was Negative.  No pelvic pain.  No pain with IC.  Urine/BMs normal.  Colono 05/2021.  Breasts normal. Mammo Neg 10/2021.  BMI 25.4.  Health labs with Fam MD.    Past medical history,surgical history, family history and social history were all reviewed and documented in the EPIC chart.  Gynecologic History No LMP recorded. Patient is perimenopausal.  Obstetric History OB History  Gravida Para Term Preterm AB Living  2 2 2  0   2  SAB IAB Ectopic Multiple Live Births               # Outcome Date GA Lbr Len/2nd Weight Sex Delivery Anes PTL Lv  2 Term           1 Term              ROS: A ROS was performed and pertinent positives and negatives are included in the history. GENERAL: No fevers or chills. HEENT: No change in vision, no earache, sore throat or sinus congestion. NECK: No pain or stiffness. CARDIOVASCULAR: No chest pain or pressure. No palpitations. PULMONARY: No shortness of breath, cough or wheeze. GASTROINTESTINAL: No abdominal pain, nausea, vomiting or diarrhea, melena or bright red blood per rectum. GENITOURINARY: No urinary frequency, urgency, hesitancy or dysuria. MUSCULOSKELETAL: No joint or muscle pain, no back pain, no recent trauma. DERMATOLOGIC: No rash, no itching, no lesions. ENDOCRINE: No polyuria, polydipsia, no heat or cold intolerance. No recent change in weight. HEMATOLOGICAL: No anemia or easy bruising or bleeding. NEUROLOGIC: No headache, seizures, numbness, tingling or weakness. PSYCHIATRIC: No depression, no loss of interest in normal  activity or change in sleep pattern.     Exam:   BP 110/64   Pulse 85   Ht 4' 11.75" (1.518 m)   Wt 129 lb (58.5 kg)   SpO2 99%   BMI 25.40 kg/m   Body mass index is 25.4 kg/m.  General appearance : Well developed well nourished female. No acute distress HEENT: Eyes: no retinal hemorrhage or exudates,  Neck supple, trachea midline, no carotid bruits, no thyroidmegaly Lungs: Clear to auscultation, no rhonchi or wheezes, or rib retractions  Heart: Regular rate and rhythm, no murmurs or gallops Breast:Examined in sitting and supine position were symmetrical in appearance, no palpable masses or tenderness,  no skin retraction, no nipple inversion, no nipple discharge, no skin discoloration, no axillary or supraclavicular lymphadenopathy Abdomen: no palpable masses or tenderness, no rebound or guarding Extremities: no edema or skin discoloration or tenderness  Pelvic: Vulva: Normal             Vagina: No gross lesions or discharge  Cervix: No gross lesions or discharge.  Pap/HPV HR done.  Uterus  AV, normal size, shape and consistency, non-tender and mobile  Adnexa  Without masses or tenderness  Anus: Normal   Assessment/Plan:  46 y.o. female for annual exam   1. Encounter for routine gynecological examination with Papanicolaou smear of cervix H/O HPV 16 positive in 2019.  Last Colpo 05/2020 CIN 1.  Pap 09/2020 LGSIL.  Pap/HPV HR today.  Postmenopausal, well on no HRT. Hot flushes tolerable. Using condoms.  Had a condom break 2 months ago and took the Morning After Pill which brought a very light spotting.  2 weeks later her HPT was Negative.  No pelvic pain.  No pain with IC.  Urine/BMs normal.  Colono 05/2021.  Breasts normal. Mammo Neg 10/2021.  BMI 25.4.  Health labs with Fam MD. - Cytology - PAP( [facility])  2. Cervical high risk HPV (human papillomavirus) test positive - Cytology - PAP( [facility])  3. Dysplasia of cervix, low grade (CIN 1) - Cytology - PAP( [facility])  4. Uses condoms for contraception  5. Postmenopause Postmenopausal, well on no HRT. Hot flushes tolerable. Using condoms.  Had a condom break 2 months ago and took the Morning After Pill which brought a very light spotting.  2 weeks later her HPT was Negative.  No pelvic pain.  No pain with IC.  6. H/O herpes genitalis Treating recurrences with Valacyclovir.  Prescription sent to pharmacy.  Other orders - valACYclovir (VALTREX) 1000 MG tablet; Take one tab po daily beginning at first signs of outbreak. Take for 5 days.   Genia Del MD, 11:06 AM 11/26/2021

## 2021-11-28 LAB — CYTOLOGY - PAP
Comment: NEGATIVE
Diagnosis: NEGATIVE
High risk HPV: NEGATIVE

## 2022-01-13 ENCOUNTER — Ambulatory Visit (INDEPENDENT_AMBULATORY_CARE_PROVIDER_SITE_OTHER): Payer: Managed Care, Other (non HMO) | Admitting: Family Medicine

## 2022-01-13 VITALS — BP 100/62 | HR 76 | Temp 97.7°F | Ht 60.0 in | Wt 130.2 lb

## 2022-01-13 DIAGNOSIS — R232 Flushing: Secondary | ICD-10-CM | POA: Diagnosis not present

## 2022-01-13 DIAGNOSIS — Z0000 Encounter for general adult medical examination without abnormal findings: Secondary | ICD-10-CM

## 2022-01-13 DIAGNOSIS — Z23 Encounter for immunization: Secondary | ICD-10-CM | POA: Diagnosis not present

## 2022-01-13 DIAGNOSIS — E785 Hyperlipidemia, unspecified: Secondary | ICD-10-CM | POA: Diagnosis not present

## 2022-01-13 DIAGNOSIS — D508 Other iron deficiency anemias: Secondary | ICD-10-CM | POA: Diagnosis not present

## 2022-01-13 NOTE — Patient Instructions (Addendum)
ashwagandha as need for anxiety/stress  Consider hydroxyzine for sleep/worry at night in the future if needed  How to help anxiety - without medication.   1) Regular Exercise - walking, jogging, cycling, dancing, strength training --> Yoga has been shown in research to reduce depression and anxiety -- with even just one hour long session per week  2)  Begin a Mindfulness/Meditation practice -- this can take a little as 3 minutes and is helpful for all kinds of mood issues -- You can find resources in books -- Or you can download apps like  ---- Headspace App (which currently has free content called "Weathering the Storm") ---- Calm (which has a few free options)  ---- Insignt Timer ---- Stop, Breathe & Think  # With each of these Apps - you should decline the "start free trial" offer and as you search through the App should be able to access some of their free content. You can also chose to pay for the content if you find one that works well for you.   # Many of them also offer sleep specific content which may help with insomnia  3) Healthy Diet -- Avoid or decrease Caffeine -- Avoid or decrease Alcohol -- Drink plenty of water, have a balanced diet -- Avoid cigarettes and marijuana (as well as other recreational drugs)  4) Consider contacting a professional therapist  -- [facility] is one option. Call [phone] -- Or you can check out www.psychologytoday.com -- you can read bios of therapists and see if they accept insurance -- Check with your insurance to see if you have coverage and who may take your insurance      Menopause Menopause may increase your risk for: Loss of bone (osteoporosis), which causes bone breaks (fractures). Depression. Hardening and narrowing of the arteries (atherosclerosis), which can cause heart attacks and strokes. What are the causes? This condition is usually caused by a natural change in hormone levels that happens as you get  older. The condition may also be caused by surgery to remove both ovaries (bilateral oophorectomy). Follow these instructions at home: Lifestyle Do not use any products that contain nicotine or tobacco, such as cigarettes and e-cigarettes. If you need help quitting, ask your health care provider. Get at least 30 minutes of physical activity on 5 or more days each week. Avoid alcoholic and caffeinated beverages, as well as spicy foods. This may help prevent hot flashes. Get 7-8 hours of sleep each night. If you have hot flashes, try: Dressing in layers. Avoiding things that may trigger hot flashes, such as spicy food, hot drinks, alcohol, caffeine, warm places, or stress. Taking slow, deep breaths when a hot flash starts. Keeping a fan in your home and office. Find ways to manage stress: regular exercise, meditation, yoga, qigong, Tai Chi, biofeedback, acupuncture or massage Consider going to group therapy with other women who are having menopause symptoms. Ask your health care provider about recommended group therapy meetings. Staying cool while sleeping: dress in light clothing, use layed bedding that can be easily removed, Sleep with a fan nearby, put an ice pack under your pillow and flip your pillow regularly  Eating and drinking Eat a healthy, balanced diet that contains whole grains, lean protein, low-fat dairy, and plenty of fruits and vegetables. Your health care provider may recommend adding more soy to your diet. Foods that contain soy include tofu, tempeh, and soy milk. Eat plenty of foods that contain calcium and vitamin D for bone health.  Items that are rich in calcium include low-fat milk, yogurt, beans, almonds, sardines, broccoli, and kale. Medicines Non-prescription medications for Hot Flashes Soy - eat 1-2 servings of soy foods daily Herbs: like black cohosh have shown some improvement with hot flashes Talk with your health care provider before starting any herbal  supplements. If prescribed, take vitamins and supplements as told by your health care provider. These may include: Calcium. Women age 45 and older should get 1,200 mg (milligrams) of calcium every day. Vitamin D. Women need 600-800 International Units of vitamin D each day. Prescription Medications Hormone Treatment: increased risk of breast cancer and cardiovascular disease. Should be used for a short time and in women under 60 have a lower risk overall if they take it Non-Hormone Treatment: Paroxetine which is an antidepressant, has been shown to reduce Hot Flashes

## 2022-01-13 NOTE — Assessment & Plan Note (Signed)
We will check labs, advised that she consider spacing out blood donation to help.

## 2022-01-13 NOTE — Progress Notes (Unsigned)
Annual Exam   Chief Complaint:  Chief Complaint  Patient presents with   Annual Exam    No concerns other than increased hot flashes.     History of Present Illness:  [name] is a 46 y.o. [phone] who LMP was No LMP recorded. Patient is perimenopausal., presents today for her annual examination.     Nutrition Diet: not great Exercise: not as active She does get adequate calcium and Vitamin D in her diet.   Social History   Tobacco Use  Smoking Status Former   Types: Cigarettes   Quit date: 1998   Years since quitting: 25.7  Smokeless Tobacco Never   Social History   Substance and Sexual Activity  Alcohol Use Yes   Comment: weekends, 1-2 glasses   Social History   Substance and Sexual Activity  Drug Use No    Safety The patient wears seatbelts: yes.     The patient feels safe at home and in their relationships: yes.  General Health Dentist in the last year: Yes Eye doctor: yes  Menstrual Last cycle was almost 1 year ago Is having hot flashes  GYN She is single partner, contraception - post menopausal status.    Cervical Cancer Screening:   Last Pap:   August 2023 Results were: no abnormalities /neg HPV DNA    Breast Cancer Screening There is no FH of breast cancer. There is no FH of ovarian cancer. BRCA screening Not Indicated.  Discussed that for average risk women between age 46-49 screening may reduce the risk of breast cancer death, however, at a lower rate than those over age 30. And that the the false-positive rates resulting in unnecessary biopsies with more screening is higher. The balance of benefits vs harms likely improves as you progress through your 40s. The patient does want a mammogram this year.   Colon Cancer Screening:  Age 54-75 yo - benefits outweigh the risk. Adults 76-85 yo who have never been screened benefit.  Benefits: 134000 people in 2016 will be diagnosed and 49,000 will die - early detection helps Harms:  Complications 2/2 to colonoscopy High Risk (Colonoscopy): genetic disorder (Lynch syndrome or familial adenomatous polyposis), personal hx of IBD, previous adenomatous polyp, or previous colorectal cancer, FamHx start 10 years before the age at diagnosis, increased in males and black race  Options:  FIT - looks for hemoglobin (blood in the stool) - specific and fairly sensitive - must be done annually Cologuard - looks for DNA and blood - more sensitive - therefore can have more false positives, every 3 years Colonoscopy - every 10 years if normal - sedation, bowl prep, must have someone drive you  Shared decision making and the patient had decided to do colonoscopy due in 2033.  Weight Wt Readings from Last 3 Encounters:  01/13/22 130 lb 4 oz (59.1 kg)  11/26/21 129 lb (58.5 kg)  05/27/21 127 lb 12.8 oz (58 kg)   Patient has normal BMI  BMI Readings from Last 1 Encounters:  01/13/22 25.44 kg/m     Chronic disease screening Blood pressure monitoring:  BP Readings from Last 3 Encounters:  01/13/22 100/62  11/26/21 110/64  05/27/21 (!) 93/56    Lipid Monitoring: Indication for screening: age >59, obesity, diabetes, family hx, CV risk factors.  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 Benign colposcopy biopsies on 04/15/18, repeat cotesting in one year   Hand pain    bilateral   HSV infection    oral & vaginal    Past  Surgical History:  Procedure Laterality Date   COLONOSCOPY WITH PROPOFOL N/A 05/27/2021   Procedure: COLONOSCOPY WITH PROPOFOL;  Surgeon: Lucilla Lame, MD;  Location: [facility];  Service: Endoscopy;  Laterality: N/A;   GANGLION CYST EXCISION Right    wrist    Prior to Admission medications   Medication Sig Start Date End Date Taking? Authorizing Provider  Cholecalciferol (VITAMIN D3 PO) Take by mouth.   Yes [provider]  Misc Natural Products (TART CHERRY ADVANCED PO) Take by mouth daily.   Yes [provider]  Multiple Vitamins-Minerals (WOMENS MULTIVITAMIN PO) Take by mouth.   Yes [provider]  Omega-3 Fatty Acids (FISH OIL PO) Take by mouth.   Yes [provider]  valACYclovir (VALTREX) 1000 MG tablet Take one tab po daily beginning at first signs of outbreak. Take for 5 days. 11/26/21  Yes Princess Bruins, MD    No Known Allergies  Gynecologic History: No LMP recorded. Patient is perimenopausal.  Obstetric History: G2P2002  Social History   Socioeconomic History   Marital status: Divorced    Spouse name: Not on file   Number of children: 2   Years of education: High school   Highest education level: Not on file  Occupational History   Occupation: Designer, jewellery  Tobacco Use   Smoking status: Former    Types: Cigarettes    Quit date: 1998    Years since quitting: 25.7   Smokeless tobacco: Never  Vaping Use   Vaping Use: Never used  Substance and Sexual Activity   Alcohol use: Yes    Comment: weekends, 1-2 glasses   Drug use: No   Sexual activity: Yes    Partners: Male    Birth control/protection: Condom    Comment: 1st intercourse- 13  Other Topics Concern   Not on file  Social History Narrative   10/04/19   From: From [place], moved in 2005   Living: living with husband (currently going through divorce) and son [name])   Work: Presenter, broadcasting and trucks      Family: has friends nearby, family is not  Radiation protection practitioner, Daughter - Arts development officer (1997) and [name] (2007)      Enjoys: gym, walking, exercise      Exercise: gym and exercise   Diet: eats small meals every 2 hours, protein supplements      Safety   Seat belts: Yes    Guns: Yes  and secure   Safe in relationships: Yes       1-2 cups caffeine daily.   Right-handed.   Social Determinants of Health   Financial Resource Strain: Not on file  Food Insecurity: Not on file  Transportation Needs: Not on file  Physical Activity: Not on file  Stress: Not on file  Social Connections: Not on file  Intimate Partner Violence: Not on file  Family History  Problem Relation Age of Onset   Other Mother        rabies from dog bite   Arthritis Father    Brain cancer Paternal Uncle    Heart attack Neg Hx     Review of Systems  Constitutional:  Negative for chills and fever.  HENT:  Negative for congestion and sore throat.   Eyes:  Negative for blurred vision and double vision.  Respiratory:  Negative for shortness of breath.   Cardiovascular:  Negative for chest pain.  Gastrointestinal:  Negative for heartburn, nausea and vomiting.  Genitourinary: Negative.   Musculoskeletal: Negative.  Negative for myalgias.  Skin:  Negative for rash.  Neurological:  Negative for dizziness and headaches.  Endo/Heme/Allergies:  Does not bruise/bleed easily.       Hot flashes  Psychiatric/Behavioral:  Negative for depression. The patient has insomnia. The patient is not nervous/anxious.      Physical Exam BP 100/62   Pulse 76   Temp 97.7 F (36.5 C) (Temporal)   Ht 5' (1.524 m)   Wt 130 lb 4 oz (59.1 kg)   SpO2 98%   BMI 25.44 kg/m    BP Readings from Last 3 Encounters:  01/13/22 100/62  11/26/21 110/64  05/27/21 (!) 93/56      Physical Exam Constitutional:      General: She is not in acute distress.    Appearance: She is well-developed. She is not diaphoretic.  HENT:     Head: Normocephalic and atraumatic.     Right Ear: External ear  normal.     Left Ear: External ear normal.     Nose: Nose normal.  Eyes:     General: No scleral icterus.    Extraocular Movements: Extraocular movements intact.     Conjunctiva/sclera: Conjunctivae normal.  Cardiovascular:     Rate and Rhythm: Normal rate and regular rhythm.     Heart sounds: No murmur heard. Pulmonary:     Effort: Pulmonary effort is normal. No respiratory distress.     Breath sounds: Normal breath sounds. No wheezing.  Abdominal:     General: Bowel sounds are normal. There is no distension.     Palpations: Abdomen is soft. There is no mass.     Tenderness: There is no abdominal tenderness. There is no guarding or rebound.  Musculoskeletal:        General: Normal range of motion.     Cervical back: Neck supple.  Lymphadenopathy:     Cervical: No cervical adenopathy.  Skin:    General: Skin is warm and dry.     Capillary Refill: Capillary refill takes less than 2 seconds.  Neurological:     Mental Status: She is alert and oriented to person, place, and time.     Deep Tendon Reflexes: Reflexes normal.  Psychiatric:        Mood and Affect: Mood normal.        Behavior: Behavior normal.      Results:  PHQ-9:  [place] Office Visit from 03/28/2019 in [facility]  PHQ-9 Total Score 2         Assessment: 46 y.o. [id number] female here for routine annual physical examination.  Plan: Problem List Items Addressed This Visit       Cardiovascular and Mediastinum   Hot flashes    Likely secondary to menopause.  Discussed soy and black cohosh.  Also discussed that she could do hormones or SSRIs in the future  if desired.  She will follow-up if she wants to try something.      Relevant Orders   Comprehensive metabolic panel   TSH     Other   Anemia, iron deficiency    We will check labs, advised that she consider spacing out blood donation to help.      Relevant Orders   CBC   Ferritin   HLD (hyperlipidemia)   Relevant  Orders   Comprehensive metabolic panel   Lipid panel   Other Visit Diagnoses     Annual physical exam    -  Primary       Screening: -- Blood pressure screen normal -- cholesterol screening: will obtain -- Weight screening: normal -- Diabetes Screening: will obtain -- Nutrition: Encouraged healthy diet  The 10-year ASCVD risk score (Arnett DK, et al., 2019) is: 0.6%   Values used to calculate the score:     Age: 49 years     Sex: Female     Is Non-Hispanic African American: No     Diabetic: No     Tobacco smoker: No     Systolic Blood Pressure: 856 mmHg     Is BP treated: No     HDL Cholesterol: 61.7 mg/dL     Total Cholesterol: 226 mg/dL  -- Statin therapy for Age 73-75 with CVD risk >7.5%  Psych -- Depression screening (PHQ-9):  [place] Visit from 03/28/2019 in [facility]  PHQ-9 Total Score 2        Safety -- tobacco screening: not using -- alcohol screening:  low-risk usage. -- no evidence of domestic violence or intimate partner violence.   Cancer Screening -- pap smear not collected per ASCCP guidelines -- family history of breast cancer screening: done. not at high risk. -- Mammogram -  up to date -- Colon cancer (age 79+)--  up to date  Immunizations Immunization History  Administered Date(s) Administered   Influenza,inj,Quad PF,6+ Mos 01/10/2015, 01/16/2016, 04/15/2018, 01/21/2019, 01/08/2021   PFIZER(Purple Top)SARS-COV-2 Vaccination 08/12/2019, 09/02/2019   Tdap 03/28/2019    -- flu vaccine up to date -- TDAP q10 years up to date -- -- Covid-19 Vaccine up to date   Encouraged healthy diet and exercise. Encouraged regular vision and dental care.   Lesleigh Noe, MD

## 2022-01-13 NOTE — Assessment & Plan Note (Signed)
Likely secondary to menopause.  Discussed soy and black cohosh.  Also discussed that she could do hormones or SSRIs in the future if desired.  She will follow-up if she wants to try something.

## 2022-01-14 LAB — CBC
HCT: 36.4 % (ref 36.0–46.0)
Hemoglobin: 11.4 g/dL — ABNORMAL LOW (ref 12.0–15.0)
MCHC: 31.3 g/dL (ref 30.0–36.0)
MCV: 75.5 fl — ABNORMAL LOW (ref 78.0–100.0)
Platelets: 370 10*3/uL (ref 150.0–400.0)
RBC: 4.82 Mil/uL (ref 3.87–5.11)
RDW: 20.5 % — ABNORMAL HIGH (ref 11.5–15.5)
WBC: 5.1 10*3/uL (ref 4.0–10.5)

## 2022-01-14 LAB — LIPID PANEL
Cholesterol: 232 mg/dL — ABNORMAL HIGH (ref 0–200)
HDL: 60.6 mg/dL (ref >39.00)
LDL Cholesterol: 131 mg/dL — ABNORMAL HIGH (ref 0–99)
NonHDL: 171.32
Total CHOL/HDL Ratio: 4
Triglycerides: 200 mg/dL — ABNORMAL HIGH (ref 0.0–149.0)
VLDL: 40 mg/dL (ref 0.0–40.0)

## 2022-01-14 LAB — COMPREHENSIVE METABOLIC PANEL
ALT: 17 U/L (ref 0–35)
AST: 21 U/L (ref 0–37)
Albumin: 4.4 g/dL (ref 3.5–5.2)
Alkaline Phosphatase: 106 U/L (ref 39–117)
BUN: 12 mg/dL (ref 6–23)
CO2: 27 mEq/L (ref 19–32)
Calcium: 9.5 mg/dL (ref 8.4–10.5)
Chloride: 103 mEq/L (ref 96–112)
Creatinine, Ser: 0.75 mg/dL (ref 0.40–1.20)
GFR: 95 mL/min (ref >60.00)
Glucose, Bld: 65 mg/dL — ABNORMAL LOW (ref 70–99)
Potassium: 4.2 mEq/L (ref 3.5–5.1)
Sodium: 138 mEq/L (ref 135–145)
Total Bilirubin: 0.5 mg/dL (ref 0.2–1.2)
Total Protein: 7.4 g/dL (ref 6.0–8.3)

## 2022-01-14 LAB — FERRITIN: Ferritin: 5.3 ng/mL — ABNORMAL LOW (ref 10.0–291.0)

## 2022-01-14 LAB — TSH: TSH: 0.4 u[IU]/mL (ref 0.35–5.50)

## 2022-10-13 ENCOUNTER — Encounter: Payer: Self-pay | Admitting: Family

## 2022-10-13 ENCOUNTER — Ambulatory Visit (INDEPENDENT_AMBULATORY_CARE_PROVIDER_SITE_OTHER): Payer: 59 | Admitting: Family

## 2022-10-13 VITALS — BP 110/62 | HR 91 | Temp 97.1°F | Ht 60.5 in | Wt 125.0 lb

## 2022-10-13 DIAGNOSIS — B009 Herpesviral infection, unspecified: Secondary | ICD-10-CM | POA: Diagnosis not present

## 2022-10-13 DIAGNOSIS — E559 Vitamin D deficiency, unspecified: Secondary | ICD-10-CM | POA: Diagnosis not present

## 2022-10-13 DIAGNOSIS — G4719 Other hypersomnia: Secondary | ICD-10-CM

## 2022-10-13 DIAGNOSIS — Z79899 Other long term (current) drug therapy: Secondary | ICD-10-CM

## 2022-10-13 DIAGNOSIS — Z8742 Personal history of other diseases of the female genital tract: Secondary | ICD-10-CM | POA: Insufficient documentation

## 2022-10-13 DIAGNOSIS — Z0001 Encounter for general adult medical examination with abnormal findings: Secondary | ICD-10-CM

## 2022-10-13 DIAGNOSIS — Z1231 Encounter for screening mammogram for malignant neoplasm of breast: Secondary | ICD-10-CM

## 2022-10-13 DIAGNOSIS — D508 Other iron deficiency anemias: Secondary | ICD-10-CM

## 2022-10-13 DIAGNOSIS — Z8669 Personal history of other diseases of the nervous system and sense organs: Secondary | ICD-10-CM | POA: Diagnosis not present

## 2022-10-13 LAB — VITAMIN D 25 HYDROXY (VIT D DEFICIENCY, FRACTURES): VITD: 37.49 ng/mL (ref 30.00–100.00)

## 2022-10-13 LAB — CBC WITH DIFFERENTIAL/PLATELET
Basophils Absolute: 0 10*3/uL (ref 0.0–0.1)
Basophils Relative: 0.8 % (ref 0.0–3.0)
Eosinophils Absolute: 0 10*3/uL (ref 0.0–0.7)
Eosinophils Relative: 0.6 % (ref 0.0–5.0)
HCT: 34.3 % — ABNORMAL LOW (ref 36.0–46.0)
Hemoglobin: 10.2 g/dL — ABNORMAL LOW (ref 12.0–15.0)
Lymphocytes Relative: 30.6 % (ref 12.0–46.0)
Lymphs Abs: 1.5 10*3/uL (ref 0.7–4.0)
MCHC: 29.8 g/dL — ABNORMAL LOW (ref 30.0–36.0)
MCV: 68.9 fl — ABNORMAL LOW (ref 78.0–100.0)
Monocytes Absolute: 0.4 10*3/uL (ref 0.1–1.0)
Monocytes Relative: 9.2 % (ref 3.0–12.0)
Neutro Abs: 2.8 10*3/uL (ref 1.4–7.7)
Neutrophils Relative %: 58.8 % (ref 43.0–77.0)
Platelets: 450 10*3/uL — ABNORMAL HIGH (ref 150.0–400.0)
RBC: 4.97 Mil/uL (ref 3.87–5.11)
RDW: 18 % — ABNORMAL HIGH (ref 11.5–15.5)
WBC: 4.8 10*3/uL (ref 4.0–10.5)

## 2022-10-13 LAB — IBC + FERRITIN
Ferritin: 4 ng/mL — ABNORMAL LOW (ref 10.0–291.0)
Iron: 25 ug/dL — ABNORMAL LOW (ref 42–145)
Saturation Ratios: 3.9 % — ABNORMAL LOW (ref 20.0–50.0)
TIBC: 646.8 ug/dL — ABNORMAL HIGH (ref 250.0–450.0)
Transferrin: 462 mg/dL — ABNORMAL HIGH (ref 212.0–360.0)

## 2022-10-13 LAB — BASIC METABOLIC PANEL
BUN: 14 mg/dL (ref 6–23)
CO2: 27 mEq/L (ref 19–32)
Calcium: 9.7 mg/dL (ref 8.4–10.5)
Chloride: 105 mEq/L (ref 96–112)
Creatinine, Ser: 0.6 mg/dL (ref 0.40–1.20)
GFR: 106.54 mL/min (ref >60.00)
Glucose, Bld: 65 mg/dL — ABNORMAL LOW (ref 70–99)
Potassium: 4.3 mEq/L (ref 3.5–5.1)
Sodium: 139 mEq/L (ref 135–145)

## 2022-10-13 LAB — TSH: TSH: 0.58 u[IU]/mL (ref 0.35–5.50)

## 2022-10-13 LAB — T3, FREE: T3, Free: 2.9 pg/mL (ref 2.3–4.2)

## 2022-10-13 LAB — VITAMIN B12: Vitamin B-12: 297 pg/mL (ref 211–911)

## 2022-10-13 NOTE — Assessment & Plan Note (Signed)
Will recheck however advise pt to take daily iron

## 2022-10-13 NOTE — Patient Instructions (Addendum)
  Continue multivitamin  Start daily iron supplement (325/65 mg, recommend SLOW FE) Checking b12 today, pending results I will advise if needed.   ------------------------------------ I have sent an electronic order over to your preferred location for the following:   []   2D Mammogram  [x]   3D Mammogram  []   Bone Density   Please give this center a call to get scheduled at your convenience.   [x]   [facility]      [address] [place], [place]        [phone]         Make sure to wear two piece  clothing  No lotions powders or deodorants the day of the appointment Make sure to bring picture ID and insurance card.  Bring list of medications you are currently taking including any supplements.    ------------------------------------

## 2022-10-13 NOTE — Assessment & Plan Note (Signed)
Suspect due to low iron  Start daily iron supplementation  Checking b12 as well

## 2022-10-13 NOTE — Progress Notes (Signed)
Established Patient Office Visit  Subjective:  Patient ID: Sabrina Randall, female    DOB: 24-Dec-1975  Age: 47 y.o. MRN: [id number]  CC:  Chief Complaint  Patient presents with   Establish Care    Would like CPE     HPI Sabrina Randall is here for a transition of care visit as well as annual exam.   Prior provider was: Dr. Gweneth Dimitri   General diet, limited meat about two times a week.  Exercise: trying to work on workout routine, walks often and does laps at pool  No known h/o STD  Utd on eye and dental exams  No advanced directive or living will  Mammogram: due after July  Pap: three years, due again in 11/2024 Colonoscopy 05/27/21  Pt is with acute concerns.  Lately with excessive daytime fatigue. She does have h/o anemia and also low potassium.   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 Benign colposcopy biopsies on 04/15/18, repeat cotesting in one year   Hand pain    bilateral   HSV infection    oral & vaginal    Past Surgical History:  Procedure Laterality Date   COLONOSCOPY WITH PROPOFOL N/A 05/27/2021   Procedure: COLONOSCOPY WITH PROPOFOL;  Surgeon: Midge Minium, MD;  Location: [facility];  Service: Endoscopy;  Laterality: N/A;   GANGLION CYST EXCISION Right    wrist    Family History  Problem Relation Age of Onset   Other  Mother        rabies from dog bite   Arthritis Father    Brain cancer Paternal Uncle    Heart attack Neg Hx     Social History   Socioeconomic History   Marital status: Legally Separated    Spouse name: Not on file   Number of children: 2   Years of education: High school   Highest education level: Not on file  Occupational History   Occupation: Development worker, community: [employer]  Tobacco Use   Smoking status: Former    Types: Cigarettes    Quit date: 1998    Years since quitting: 26.4   Smokeless tobacco: Never  Vaping Use   Vaping Use: Never used  Substance and Sexual Activity   Alcohol use: Yes    Comment: weekends, 1-2 glasses   Drug use: No   Sexual activity: Yes    Partners: Male    Birth control/protection: Post-menopausal, Condom  Other Topics Concern   Not on file  Social History Narrative   10/04/19   From: From [place], moved in 2005   Living: separated from husband has another partner, and son Irving Shows)   Work: Software engineer and trucks  Family: has friends nearby, family is not local, Daughter - Wyatt Mage (47) and Irving Shows (2007)      Enjoys: gym, walking, exercise      Exercise: gym and exercise   Diet: eats small meals every 2 hours, protein supplements      Safety   Seat belts: Yes    Guns: Yes  and secure   Safe in relationships: Yes       1-2 cups caffeine daily.   Right-handed.   Social Determinants of Health   Financial Resource Strain: Not on file  Food Insecurity: Not on file  Transportation Needs: Not on file  Physical Activity: Not on file  Stress: Not on file  Social Connections: Not on file  Intimate Partner Violence: Not on file    Outpatient Medications Prior to Visit  Medication Sig Dispense Refill   Cholecalciferol (VITAMIN D3 PO) Take by mouth.     Misc Natural Products (TART CHERRY ADVANCED PO) Take by mouth daily.     Multiple Vitamins-Minerals (WOMENS MULTIVITAMIN PO) Take by mouth.     Omega-3 Fatty Acids  (FISH OIL PO) Take by mouth.     valACYclovir (VALTREX) 1000 MG tablet Take one tab po daily beginning at first signs of outbreak. Take for 5 days. 15 tablet 3   No facility-administered medications prior to visit.    No Known Allergies  ROS: Pertinent symptoms negative unless otherwise noted in HPI      Objective:    Physical Exam Vitals reviewed.  Constitutional:      General: She is not in acute distress.    Appearance: Normal appearance. She is not ill-appearing or toxic-appearing.  HENT:     Right Ear: Tympanic membrane normal.     Left Ear: Tympanic membrane normal.     Mouth/Throat:     Mouth: Mucous membranes are moist.     Pharynx: No pharyngeal swelling.     Tonsils: No tonsillar exudate.  Eyes:     Extraocular Movements: Extraocular movements intact.     Conjunctiva/sclera: Conjunctivae normal.     Pupils: Pupils are equal, round, and reactive to light.  Neck:     Thyroid: No thyroid mass.  Cardiovascular:     Rate and Rhythm: Normal rate and regular rhythm.  Pulmonary:     Effort: Pulmonary effort is normal.     Breath sounds: Normal breath sounds.  Abdominal:     General: Abdomen is flat. Bowel sounds are normal.     Palpations: Abdomen is soft.  Musculoskeletal:        General: Normal range of motion.  Lymphadenopathy:     Cervical:     Right cervical: No superficial cervical adenopathy.    Left cervical: No superficial cervical adenopathy.  Skin:    General: Skin is warm.     Capillary Refill: Capillary refill takes less than 2 seconds.  Neurological:     General: No focal deficit present.     Mental Status: She is alert and oriented to person, place, and time.  Psychiatric:        Mood and Affect: Mood normal.        Behavior: Behavior normal.        Thought Content: Thought content normal.        Judgment: Judgment normal.       BP 110/62   Pulse 91   Temp (!) 97.1 F (36.2 C) (Temporal)   Ht 5' 0.5" (1.537 m)   Wt 125 lb (  56.7 kg)    LMP 10/12/2021   SpO2 98%   BMI 24.01 kg/m  Wt Readings from Last 3 Encounters:  10/13/22 125 lb (56.7 kg)  01/13/22 130 lb 4 oz (59.1 kg)  11/26/21 129 lb (58.5 kg)     Health Maintenance Due  Topic Date Due   COVID-19 Vaccine (3 - 2023-24 season) 12/20/2021    There are no preventive care reminders to display for this patient.  Lab Results  Component Value Date   TSH 0.40 01/13/2022   Lab Results  Component Value Date   WBC 5.1 01/13/2022   HGB 11.4 (L) 01/13/2022   HCT 36.4 01/13/2022   MCV 75.5 (L) 01/13/2022   PLT 370.0 01/13/2022   Lab Results  Component Value Date   NA 138 01/13/2022   K 4.2 01/13/2022   CO2 27 01/13/2022   GLUCOSE 65 (L) 01/13/2022   BUN 12 01/13/2022   CREATININE 0.75 01/13/2022   BILITOT 0.5 01/13/2022   ALKPHOS 106 01/13/2022   AST 21 01/13/2022   ALT 17 01/13/2022   PROT 7.4 01/13/2022   ALBUMIN 4.4 01/13/2022   CALCIUM 9.5 01/13/2022   GFR 95.00 01/13/2022   Lab Results  Component Value Date   CHOL 232 (H) 01/13/2022   Lab Results  Component Value Date   HDL 60.60 01/13/2022   Lab Results  Component Value Date   LDLCALC 131 (H) 01/13/2022   Lab Results  Component Value Date   TRIG 200.0 (H) 01/13/2022   Lab Results  Component Value Date   CHOLHDL 4 01/13/2022   No results found for: "HGBA1C"    Assessment & Plan:   HSV-2 (herpes simplex virus 2) infection  History of carpal tunnel syndrome  On statin therapy  Screening mammogram for breast cancer -     3D Screening Mammogram, Left and Right; Future  Excessive daytime sleepiness Assessment & Plan: Suspect due to low iron  Start daily iron supplementation  Checking b12 as well   Orders: -     TSH -     T3, free -     Basic metabolic panel -     Vitamin B12  Vitamin D deficiency Assessment & Plan: Continue daily vitamin d Checking labs today pending results.   Orders: -     VITAMIN D 25 Hydroxy (Vit-D Deficiency, Fractures)  Other iron  deficiency anemia Assessment & Plan: Will recheck however advise pt to take daily iron   Orders: -     CBC with Differential/Platelet -     IBC + Ferritin -     Vitamin B12 -     CBC with Differential/Platelet; Future -     IBC + Ferritin; Future  Encounter for general adult medical examination with abnormal findings Assessment & Plan: Patient Counseling(The following topics were reviewed):  Preventative care handout given to pt  Health maintenance and immunizations reviewed. Please refer to Health maintenance section. Pt advised on safe sex, wearing seatbelts in car, and proper nutrition labwork ordered today for annual Dental health: Discussed importance of regular tooth brushing, flossing, and dental visits.    History of abnormal cervical Pap smear    No orders of the defined types were placed in this encounter.   Follow-up: Return in about 1 year (around 10/13/2023) for f/u CPE.    Mort Sawyers, FNP

## 2022-10-13 NOTE — Assessment & Plan Note (Signed)
Patient Counseling(The following topics were reviewed): ? Preventative care handout given to pt  ?Health maintenance and immunizations reviewed. Please refer to Health maintenance section. ?Pt advised on safe sex, wearing seatbelts in car, and proper nutrition ?labwork ordered today for annual ?Dental health: Discussed importance of regular tooth brushing, flossing, and dental visits. ? ? ?

## 2022-10-13 NOTE — Assessment & Plan Note (Signed)
Continue daily vitamin d Checking labs today pending results.

## 2022-10-14 ENCOUNTER — Other Ambulatory Visit: Payer: Self-pay | Admitting: Family

## 2022-10-14 DIAGNOSIS — D508 Other iron deficiency anemias: Secondary | ICD-10-CM

## 2022-10-14 DIAGNOSIS — G4719 Other hypersomnia: Secondary | ICD-10-CM

## 2022-10-14 NOTE — Progress Notes (Signed)
Again incidentally noticing where you sugar dips so this is likely the reason for your dizziness. Make sure you are eating small snacks every 2-3 hours during the day.   Your iron is incredibly low. I am going to refer you to hematology as you will likely need iron infusions. I also highly suggest you start taking daily iron.  Please let us know if you have not heard back within 2 weeks about the referral.   B12 on the lower end, suggest starting over the counter b12 1000 mcg once daily.

## 2022-10-15 ENCOUNTER — Telehealth: Payer: Self-pay | Admitting: Family

## 2022-10-15 NOTE — Telephone Encounter (Signed)
Called pt about lab results, and she stated that she would start taking iron and B-12

## 2022-10-15 NOTE — Telephone Encounter (Signed)
Patient returned call to the office regarding lab results, asked for a call back whenever possible. Please advise, thank you.

## 2022-11-18 ENCOUNTER — Ambulatory Visit
Admission: RE | Admit: 2022-11-18 | Discharge: 2022-11-18 | Disposition: A | Discharge status: Home / Self Care | Payer: 59 | Source: Ambulatory Visit | Attending: Family | Admitting: Family

## 2022-11-18 DIAGNOSIS — Z1231 Encounter for screening mammogram for malignant neoplasm of breast: Secondary | ICD-10-CM

## 2022-12-04 ENCOUNTER — Other Ambulatory Visit: Payer: Self-pay

## 2022-12-04 ENCOUNTER — Inpatient Hospital Stay: Payer: 59

## 2022-12-04 ENCOUNTER — Inpatient Hospital Stay: Payer: 59 | Attending: Nurse Practitioner | Admitting: Nurse Practitioner

## 2022-12-04 VITALS — BP 110/73 | HR 93 | Temp 98.9°F | Resp 20 | Wt 122.1 lb

## 2022-12-04 DIAGNOSIS — D509 Iron deficiency anemia, unspecified: Secondary | ICD-10-CM

## 2022-12-04 DIAGNOSIS — Z87891 Personal history of nicotine dependence: Secondary | ICD-10-CM | POA: Insufficient documentation

## 2022-12-04 DIAGNOSIS — Z808 Family history of malignant neoplasm of other organs or systems: Secondary | ICD-10-CM | POA: Diagnosis not present

## 2022-12-04 LAB — IRON AND IRON BINDING CAPACITY (CC-WL,HP ONLY)
Iron: 28 ug/dL (ref 28–170)
Saturation Ratios: 5 % — ABNORMAL LOW (ref 10.4–31.8)
TIBC: 596 ug/dL — ABNORMAL HIGH (ref 250–450)
UIBC: 568 ug/dL — ABNORMAL HIGH (ref 148–442)

## 2022-12-04 LAB — CBC WITH DIFFERENTIAL/PLATELET
Abs Immature Granulocytes: 0.01 10*3/uL (ref 0.00–0.07)
Basophils Absolute: 0 10*3/uL (ref 0.0–0.1)
Basophils Relative: 0 %
Eosinophils Absolute: 0.1 10*3/uL (ref 0.0–0.5)
Eosinophils Relative: 1 %
HCT: 34.9 % — ABNORMAL LOW (ref 36.0–46.0)
Hemoglobin: 10.5 g/dL — ABNORMAL LOW (ref 12.0–15.0)
Immature Granulocytes: 0 %
Lymphocytes Relative: 31 %
Lymphs Abs: 1.9 10*3/uL (ref 0.7–4.0)
MCH: 21 pg — ABNORMAL LOW (ref 26.0–34.0)
MCHC: 30.1 g/dL (ref 30.0–36.0)
MCV: 69.9 fL — ABNORMAL LOW (ref 80.0–100.0)
Monocytes Absolute: 0.4 10*3/uL (ref 0.1–1.0)
Monocytes Relative: 6 %
Neutro Abs: 3.7 10*3/uL (ref 1.7–7.7)
Neutrophils Relative %: 62 %
Platelets: 405 10*3/uL — ABNORMAL HIGH (ref 150–400)
RBC: 4.99 MIL/uL (ref 3.87–5.11)
RDW: 21.7 % — ABNORMAL HIGH (ref 11.5–15.5)
WBC: 6 10*3/uL (ref 4.0–10.5)
nRBC: 0 % (ref 0.0–0.2)

## 2022-12-04 LAB — VITAMIN B12: Vitamin B-12: 328 pg/mL (ref 180–914)

## 2022-12-04 NOTE — Progress Notes (Addendum)
[facility]   Telephone:([phone] [phone]   Clinic New consult Note   Patient Care Team: Mort Sawyers, FNP as PCP - General (Family Medicine) 12/04/2022  CHIEF COMPLAINTS/PURPOSE OF CONSULTATION:  Iron deficiency Anemia  ASSESSMENT & PLAN:  Sabrina Randall is a 47 y.o. female with a history of iron deficiency anemia.  1.  Anemia Labs done per PCP 10/13/2022 showed significant iron deficiency. Upon review, patient has had low ferritin for much of the past 5 years. Per patient, she has been anemic for as long as she can remember. She states that she does have fatigue and symptoms associated with orthostatic hypotension. She denies chest pain, chest pressure, or shortness of breath. She denies headaches or visual disturbances. She denies abdominal pain, nausea, vomiting, or changes in bowel or bladder habits.  Colonoscopy done 05/27/2021 did show non-bleeding, internal hemorrhoids, but was negative, otherwise. She denies blood in her stool, hematuria, or hematemesis. She states that she has not had menstrual periods in well over a year. States that she does not eat a lot of meat. Two days of the week, she does not eat meat at all. She has been trying to increase protein intake since labs were done in June 2024. She has increased intake of green leafy vegetables, she is eating nuts and other legumes. She has added pork and salmon to her diet on the days when she eats meat.   PLAN: Draw labs today. -cbc/diff -ferritin  -iron and TIBC -b12 and folate Refer to GI for EGD and testing for celiac disease.  Recommend prenatal vitamin with extra iron and b12 every day. Any brand of over the counter prenatal vitamin is good as long as it contains the extra iron. If needed, she may need to get separate B12 vitamin. She understands that she should take these everyday.  Recheck labs in 3 months after oral therapy initiated. Plan to begin therapy with IV iron if indicated.     HISTORY OF PRESENTING ILLNESS:  Sabrina Randall 47 y.o. female is here because of microcytic, iron deficiency anemia.  She was found to have abnormal CBC from PCP on 10/13/2022 She denies recent chest pain on exertion, shortness of breath on minimal exertion, pre-syncopal episodes, or palpitations. She had not noticed any recent bleeding such as epistaxis, hematuria or hematochezia The patient denies over the counter NSAID ingestion. She is not on antiplatelets agents. Her last colonoscopy was 05/27/2021. She did have non-bleeding, internal hemorrhoids but was normal otherwise.  She had no prior history or diagnosis of cancer. Her age appropriate screening programs are up-to-date. She denies any pica and eats a variety of diet. Has been trying to increase intake of protein, including different meats, green, leafy vegetables, and legumes.  She has donated blood. She states that she has been unable to donate blood the last several months due to anemia.  The patient was advised to take oral iron supplements, but has not taken them up until this point.   REVIEW OF SYSTEMS:   Constitutional: Denies fevers, chills or abnormal night sweats. Admits to fatigue  Eyes: Denies blurriness of vision, double vision or watery eyes Ears, nose, mouth, throat, and face: Denies mucositis or sore throat Respiratory: Denies cough, dyspnea or wheezes Cardiovascular: Denies palpitation, chest discomfort or lower extremity swelling. Does have some dizziness when standing up quickly from a seated position or straightening up from being bent over at the waist.  Gastrointestinal:  Denies nausea, heartburn or change  in bowel habits Skin: Denies abnormal skin rashes Lymphatics: Denies new lymphadenopathy or easy bruising Neurological:Denies numbness, tingling or new weaknesses Behavioral/Psych: Mood is stable, no new changes   All other systems were reviewed with the patient and are negative.   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 Benign colposcopy biopsies on 04/15/18, repeat cotesting in one year   Hand pain    bilateral   HSV infection    oral & vaginal    SURGICAL HISTORY: Past Surgical History:  Procedure Laterality Date   COLONOSCOPY WITH PROPOFOL N/A 05/27/2021   Procedure: COLONOSCOPY WITH PROPOFOL;  Surgeon: Midge Minium, MD;  Location: [facility];  Service: Endoscopy;  Laterality: N/A;   GANGLION CYST EXCISION Right    wrist    SOCIAL HISTORY: Social History   Socioeconomic History   Marital status: Legally Separated    Spouse name: Not on file   Number of children: 2   Years of education: High school   Highest education level: Not on file  Occupational History   Occupation: Development worker, community: [employer]  Tobacco Use   Smoking status: Former    Current packs/day: 0.00    Types: Cigarettes    Quit date: 1998    Years since quitting: 26.6   Smokeless tobacco: Never  Vaping Use   Vaping status: Never Used  Substance and Sexual Activity   Alcohol use: Yes    Comment: weekends, 1-2 glasses   Drug use: No   Sexual activity: Yes    Partners: Male    Birth control/protection: Post-menopausal, Condom  Other Topics Concern   Not on file  Social History Narrative   10/04/19   From: From [place], moved in 2005   Living: separated from husband has another partner, and son Irving Shows)   Work: Software engineer and trucks      Family: has friends nearby, family is not Designer, multimedia, Daughter - Therapist, music (1997) and Irving Shows (2007)      Enjoys: gym, walking, exercise      Exercise: gym and exercise   Diet: eats small meals every 2 hours, protein supplements      Safety   Seat belts: Yes    Guns: Yes  and secure   Safe in relationships: Yes       1-2 cups caffeine daily.    Right-handed.   Social Determinants of Health   Financial Resource Strain: Not on file  Food Insecurity: Not on file  Transportation Needs: Not on file  Physical Activity: Not on file  Stress: Not on file  Social Connections: Not on file  Intimate Partner Violence: Not on file    FAMILY HISTORY: Family History  Problem Relation Age of Onset   Other Mother        rabies from dog bite   Arthritis Father    Brain cancer Paternal Uncle    Heart attack Neg Hx     ALLERGIES:  has No Known Allergies.  MEDICATIONS:  Current Outpatient Medications  Medication Sig Dispense Refill   Cholecalciferol (VITAMIN D3 PO) Take by mouth.     Misc Natural Products (TART CHERRY ADVANCED PO) Take by mouth daily.     Multiple Vitamins-Minerals (WOMENS MULTIVITAMIN PO) Take by mouth.     Omega-3 Fatty Acids (  FISH OIL PO) Take by mouth.     valACYclovir (VALTREX) 1000 MG tablet Take one tab po daily beginning at first signs of outbreak. Take for 5 days. 15 tablet 3   No current facility-administered medications for this visit.    PHYSICAL EXAMINATION: ECOG PERFORMANCE STATUS: 1 - Symptomatic but completely ambulatory  Vitals:   12/04/22 1358  BP: 110/73  Pulse: 93  Resp: 20  Temp: 98.9 F (37.2 C)  SpO2: 99%   Filed Weights   12/04/22 1358  Weight: 122 lb 1.6 oz (55.4 kg)    GENERAL:alert, no distress and comfortable SKIN: skin color, texture, turgor are normal, no rashes or significant lesions EYES: normal, conjunctiva are pink and non-injected, sclera clear OROPHARYNX:no exudate, no erythema and lips, buccal mucosa, and tongue normal  NECK: supple, thyroid normal size, non-tender, without nodularity LYMPH:  no palpable lymphadenopathy in the cervical, axillary or inguinal LUNGS: clear to auscultation and percussion with normal breathing effort HEART: regular rate & rhythm and no murmurs and no lower extremity edema ABDOMEN:abdomen soft, non-tender and normal bowel  sounds Musculoskeletal:no cyanosis of digits and no clubbing  PSYCH: alert & oriented x 3 with fluent speech NEURO: no focal motor/sensory deficits  LABORATORY DATA:  I have reviewed the data as listed    Latest Ref Rng & Units 12/04/2022    3:08 PM 10/13/2022   11:40 AM 01/13/2022    2:40 PM  CBC  WBC 4.0 - 10.5 K/uL 6.0  4.8  5.1   Hemoglobin 12.0 - 15.0 g/dL 40.9  81.1  91.4   Hematocrit 36.0 - 46.0 % 34.9  34.3  36.4   Platelets 150 - 400 K/uL 405  450.0  370.0        Latest Ref Rng & Units 10/13/2022   11:40 AM 01/13/2022    2:40 PM 01/22/2021    9:14 AM  CMP  Glucose 70 - 99 mg/dL 65  65  83   BUN 6 - 23 mg/dL 14  12  15    Creatinine 0.40 - 1.20 mg/dL 7.82  9.56  2.13   Sodium 135 - 145 mEq/L 139  138  139   Potassium 3.5 - 5.1 mEq/L 4.3  4.2  4.1   Chloride 96 - 112 mEq/L 105  103  103   CO2 19 - 32 mEq/L 27  27  28    Calcium 8.4 - 10.5 mg/dL 9.7  9.5  9.4   Total Protein 6.0 - 8.3 g/dL  7.4  6.9   Total Bilirubin 0.2 - 1.2 mg/dL  0.5  0.7   Alkaline Phos 39 - 117 U/L  106  112   AST 0 - 37 U/L  21  18   ALT 0 - 35 U/L  17  15    Lab Results  Component Value Date   FERRITIN 4.0 (L) 10/13/2022    Lab Results  Component Value Date   VITAMINB12 297 10/13/2022     RADIOGRAPHIC STUDIES: I have personally reviewed the radiological images as listed and agreed with the findings in the report. MM 3D SCREENING MAMMOGRAM BILATERAL BREAST  Result Date: 11/19/2022 CLINICAL DATA:  Screening. EXAM: DIGITAL SCREENING BILATERAL MAMMOGRAM WITH TOMOSYNTHESIS AND CAD TECHNIQUE: Bilateral screening digital craniocaudal and mediolateral oblique mammograms were obtained. Bilateral screening digital breast tomosynthesis was performed. The images were evaluated with computer-aided detection. COMPARISON:  Previous exam(s). ACR Breast Density Category c: The breasts are heterogeneously dense, which may obscure small masses. FINDINGS: There are  no findings suspicious for malignancy.  IMPRESSION: No mammographic evidence of malignancy. A result letter of this screening mammogram will be mailed directly to the patient. RECOMMENDATION: Screening mammogram in one year. (Code:SM-B-01Y) BI-RADS CATEGORY  1: Negative. Electronically Signed   By: Edwin Cap M.D.   On: 11/19/2022 11:22    Orders Placed This Encounter  Procedures   Ferritin    Standing Status:   Future    Number of Occurrences:   1    Standing Expiration Date:   12/03/2023   Folate RBC    Standing Status:   Future    Number of Occurrences:   1    Standing Expiration Date:   12/03/2023   Vitamin B12    Standing Status:   Future    Number of Occurrences:   1    Standing Expiration Date:   12/03/2023   CBC with Differential/Platelet    Standing Status:   Future    Number of Occurrences:   1    Standing Expiration Date:   12/03/2023   Ambulatory referral to Gastroenterology    Referral Priority:   Routine    Referral Type:   Consultation    Referral Reason:   Specialty Services Required    Referred to Provider:   Midge Minium, MD    Number of Visits Requested:   1    All questions were answered. The patient knows to call the clinic with any problems, questions or concerns. The total time spent in the appointment was 30 minutes.     Carlean Jews, NP 12/04/2022 3:40 PM  Addendum I have seen the patient, examined her. I agree with the assessment and and plan and have edited the notes.   47 year old female is here for iron deficient anemia.  She has not had a menstrual period for the past a year, this is little concerning for other causes of iron deficient anemia.  She had a colonoscopy in February 2023, I recommend a EGD, and a possible capsule endoscopy to rule out a GI bleeding.  She will contact her GI to schedule.  Celiac disease can be ruled out during EGD and a biopsy.  She has not been consistently taking oral iron, we discussed different forms of oral iron, during the slightly low B12 level,  prenatal vitamin 1-2 times daily would be a reasonable choice, plus minus oral ferrous sulfate once daily.  All questions were answered, will repeat her CBC and iron study, B12, in 3 months, to see if she needs IV iron or B12 injection if oral replacement is not adequate.  She agrees with the plan.  I spent a total of 30 minutes for her visit today, more than 50% time on face-to-face counseling.  Malachy Mood MD 12/04/2022

## 2022-12-04 NOTE — Assessment & Plan Note (Addendum)
12/04/2022 - reviewed most recent labs from PCP done 10/13/2022 Hgb 10.2; Hct 34.3, Plt 450; Ferritin 4.0; vitamin B12 297 Was recommended she take both iron and B12 supplements per PCP

## 2022-12-05 ENCOUNTER — Telehealth: Payer: Self-pay | Admitting: Nurse Practitioner

## 2022-12-05 ENCOUNTER — Encounter: Payer: Self-pay | Admitting: Nurse Practitioner

## 2022-12-05 ENCOUNTER — Other Ambulatory Visit: Payer: Self-pay

## 2022-12-05 LAB — FERRITIN: Ferritin: 5 ng/mL — ABNORMAL LOW (ref 11–307)

## 2022-12-05 LAB — FOLATE RBC
Folate, Hemolysate: 350 ng/mL
Folate, RBC: 967 ng/mL (ref >498)
Hematocrit: 36.2 % (ref 34.0–46.6)

## 2022-12-08 ENCOUNTER — Encounter: Payer: Self-pay | Admitting: Nurse Practitioner

## 2022-12-08 NOTE — Progress Notes (Signed)
MyChart message sent to patient regarding labs -  confirm iron deficiency anemia. Have you started the oral prenatal vitamins with extra iron and a B12 supplement? I would like for you to take these daily until appointment in November. We will recheck your labs at that time. If still low, we can do a trial of iron infusion and perhaps B12 injection.

## 2022-12-10 ENCOUNTER — Other Ambulatory Visit: Payer: Self-pay | Admitting: Obstetrics & Gynecology

## 2022-12-11 NOTE — Telephone Encounter (Signed)
Per pharmacy, they did not get the electronic rx. I gave verbal order for valtrex that was sent.

## 2022-12-11 NOTE — Telephone Encounter (Signed)
Medication refill request: valtrex 1000mg  Last AEX:  11-26-21 Next AEX: not scheduled. Message sent to scheduling department to schedule aex Last MMG (if hormonal medication request): n/a Refill authorized: please approve if appropriate

## 2023-03-03 ENCOUNTER — Inpatient Hospital Stay: Payer: 59 | Attending: Nurse Practitioner

## 2023-03-03 ENCOUNTER — Other Ambulatory Visit: Payer: Self-pay

## 2023-03-03 DIAGNOSIS — D509 Iron deficiency anemia, unspecified: Secondary | ICD-10-CM | POA: Insufficient documentation

## 2023-03-03 LAB — CBC WITH DIFFERENTIAL (CANCER CENTER ONLY)
Abs Immature Granulocytes: 0.02 10*3/uL (ref 0.00–0.07)
Basophils Absolute: 0 10*3/uL (ref 0.0–0.1)
Basophils Relative: 1 %
Eosinophils Absolute: 0.1 10*3/uL (ref 0.0–0.5)
Eosinophils Relative: 1 %
HCT: 41 % (ref 36.0–46.0)
Hemoglobin: 14 g/dL (ref 12.0–15.0)
Immature Granulocytes: 0 %
Lymphocytes Relative: 26 %
Lymphs Abs: 1.6 10*3/uL (ref 0.7–4.0)
MCH: 28.5 pg (ref 26.0–34.0)
MCHC: 34.1 g/dL (ref 30.0–36.0)
MCV: 83.5 fL (ref 80.0–100.0)
Monocytes Absolute: 0.4 10*3/uL (ref 0.1–1.0)
Monocytes Relative: 6 %
Neutro Abs: 4.1 10*3/uL (ref 1.7–7.7)
Neutrophils Relative %: 66 %
Platelet Count: 258 10*3/uL (ref 150–400)
RBC: 4.91 MIL/uL (ref 3.87–5.11)
RDW: 22.4 % — ABNORMAL HIGH (ref 11.5–15.5)
WBC Count: 6.1 10*3/uL (ref 4.0–10.5)
nRBC: 0 % (ref 0.0–0.2)

## 2023-03-03 LAB — IRON AND IRON BINDING CAPACITY (CC-WL,HP ONLY)
Iron: 185 ug/dL — ABNORMAL HIGH (ref 28–170)
Saturation Ratios: 37 % — ABNORMAL HIGH (ref 10.4–31.8)
TIBC: 498 ug/dL — ABNORMAL HIGH (ref 250–450)
UIBC: 313 ug/dL (ref 148–442)

## 2023-03-03 LAB — FERRITIN: Ferritin: 7 ng/mL — ABNORMAL LOW (ref 11–307)

## 2023-03-05 ENCOUNTER — Inpatient Hospital Stay: Payer: 59 | Admitting: Nurse Practitioner

## 2023-03-05 NOTE — Assessment & Plan Note (Signed)
12/04/2022 - reviewed most recent labs from PCP done 10/13/2022 Hgb 10.2; Hct 34.3, Plt 450; Ferritin 4.0; vitamin B12 297 Was recommended she take both iron and B12 supplements per PCP

## 2023-03-05 NOTE — Assessment & Plan Note (Deleted)
12/04/2022 - reviewed most recent labs from PCP done 10/13/2022 Hgb 10.2; Hct 34.3, Plt 450; Ferritin 4.0; vitamin B12 297 Has now started oral iron and B12 supplements -Hgb and Hct have improved, though serum ferritin is unchanged.

## 2023-03-05 NOTE — Progress Notes (Deleted)
I connected with Sabrina Randall on 03/05/23 at  1:30 PM EST by @[email or web address] @ and verified that I am speaking with the correct person using two identifiers.   I discussed the limitations, risks, security and privacy concerns of performing an evaluation and management service by telemedicine and the availability of in-person appointments. I also discussed with the patient that there may be a patient responsible charge related to this service. The patient expressed understanding and agreed to proceed.   Other persons participating in the visit and their role in the encounter: ***   Patient's location: ***  Provider's location: ***   Chief Complaint: ***       [facility] OFFICE PROGRESS NOTE  Mort Sawyers, FNP  ASSESSMENT & PLAN:  No problem-specific Assessment & Plan notes found for this encounter.   No orders of the defined types were placed in this encounter.   The total time spent in the appointment was {CHL ONC TIME VISIT - [id number]} encounter with patients including review of chart and various tests results, discussions about plan of care and coordination of care plan   All questions were answered. The patient knows to call the clinic with any problems, questions or concerns. No barriers to learning was detected.    Carlean Jews, NP 11/14/20241:32 PM  INTERVAL HISTORY: Sabrina Randall 47 y.o. female returns for ***  SUMMARY OF HEMATOLOGIC HISTORY: ***  I have reviewed the past medical history, past surgical history, social history and family history with the patient and they are unchanged from previous note.  ALLERGIES:  has No Known Allergies.  MEDICATIONS:  Current Outpatient Medications  Medication Sig Dispense Refill   Cholecalciferol (VITAMIN D3 PO) Take by mouth.     Misc Natural Products (TART CHERRY ADVANCED PO) Take by mouth daily.     Multiple Vitamins-Minerals (WOMENS MULTIVITAMIN PO) Take by mouth.      Omega-3 Fatty Acids (FISH OIL PO) Take by mouth.     valACYclovir (VALTREX) 1000 MG tablet TAKE ONE TAB DAILY BEGINNING AT FIRST SIGNS OF OUTBREAK. TAKE FOR 5 DAYS. 15 tablet 0   No current facility-administered medications for this visit.     REVIEW OF SYSTEMS:   Constitutional: Denies fevers, chills or night sweats Eyes: Denies blurriness of vision Ears, nose, mouth, throat, and face: Denies mucositis or sore throat Respiratory: Denies cough, dyspnea or wheezes Cardiovascular: Denies palpitation, chest discomfort or lower extremity swelling Gastrointestinal:  Denies nausea, heartburn or change in bowel habits Skin: Denies abnormal skin rashes Lymphatics: Denies new lymphadenopathy or easy bruising Neurological:Denies numbness, tingling or new weaknesses Behavioral/Psych: Mood is stable, no new changes  All other systems were reviewed with the patient and are negative.  PHYSICAL EXAMINATION: ECOG PERFORMANCE STATUS: {CHL ONC ECOG PS:[id number]}  There were no vitals filed for this visit. There were no vitals filed for this visit.  GENERAL:alert, no distress and comfortable SKIN: skin color, texture, turgor are normal, no rashes or significant lesions EYES: normal, Conjunctiva are pink and non-injected, sclera clear OROPHARYNX:no exudate, no erythema and lips, buccal mucosa, and tongue normal  NECK: supple, thyroid normal size, non-tender, without nodularity LYMPH:  no palpable lymphadenopathy in the cervical, axillary or inguinal LUNGS: clear to auscultation and percussion with normal breathing effort HEART: regular rate & rhythm and no murmurs and no lower extremity edema ABDOMEN:abdomen soft, non-tender and normal bowel sounds Musculoskeletal:no cyanosis of digits and no clubbing  NEURO: alert & oriented x 3 with fluent speech, no focal  motor/sensory deficits  LABORATORY DATA:  I have reviewed the data as listed     Component Value Date/Time   NA 139 10/13/2022 1140   K  4.3 10/13/2022 1140   CL 105 10/13/2022 1140   CO2 27 10/13/2022 1140   GLUCOSE 65 (L) 10/13/2022 1140   BUN 14 10/13/2022 1140   CREATININE 0.60 10/13/2022 1140   CALCIUM 9.7 10/13/2022 1140   PROT 7.4 01/13/2022 1440   ALBUMIN 4.4 01/13/2022 1440   AST 21 01/13/2022 1440   ALT 17 01/13/2022 1440   ALKPHOS 106 01/13/2022 1440   BILITOT 0.5 01/13/2022 1440    No results found for: "SPEP", "UPEP"  Lab Results  Component Value Date   WBC 6.1 03/03/2023   NEUTROABS 4.1 03/03/2023   HGB 14.0 03/03/2023   HCT 41.0 03/03/2023   MCV 83.5 03/03/2023   PLT 258 03/03/2023      Chemistry      Component Value Date/Time   NA 139 10/13/2022 1140   K 4.3 10/13/2022 1140   CL 105 10/13/2022 1140   CO2 27 10/13/2022 1140   BUN 14 10/13/2022 1140   CREATININE 0.60 10/13/2022 1140      Component Value Date/Time   CALCIUM 9.7 10/13/2022 1140   ALKPHOS 106 01/13/2022 1440   AST 21 01/13/2022 1440   ALT 17 01/13/2022 1440   BILITOT 0.5 01/13/2022 1440       RADIOGRAPHIC STUDIES: I have personally reviewed the radiological images as listed and agreed with the findings in the report. No results found.

## 2023-03-05 NOTE — Progress Notes (Deleted)
I connected with Sabrina Randall on 03/05/23 at  8:30 AM EST by telephone and verified that I am speaking with the correct person using two identifiers.   I discussed the limitations, risks, security and privacy concerns of performing an evaluation and management service by telemedicine and the availability of in-person appointments. I also discussed with the patient that there may be a patient responsible charge related to this service. The patient expressed understanding and agreed to proceed.   Other persons participating in the visit and their role in the encounter: n/a   Patient's location: home  Provider's location: [facility]   Chief Complaint: iron deficiency anemia    Patient Care Team: Mort Sawyers, FNP as PCP - General (Family Medicine) Midge Minium, MD as Consulting Physician (Gastroenterology)  Clinic Day:  03/05/2023  Referring physician: Mort Sawyers, FNP  ASSESSMENT & PLAN:   Assessment & Plan: Microcytic hypochromic anemia 12/04/2022 - reviewed most recent labs from PCP done 10/13/2022 Hgb 10.2; Hct 34.3, Plt 450; Ferritin 4.0; vitamin B12 297 Has now started oral iron and B12 supplements -Hgb and Hct have improved, though serum ferritin is unchanged.     The patient understands the plans discussed today and is in agreement with them.  She knows to contact our office if she develops concerns prior to her next appointment.  I provided *** minutes of face-to-face time during this encounter and > 50% was spent counseling as documented under my assessment and plan.    Carlean Jews, NP  [facility] - A DEPT OF [facility] [address] [place] [address] Dept: [phone] Dept Fax: [phone]   No orders of the defined types were placed in this encounter.     CHIEF COMPLAINT:  CC: iron deficiency anemia  Current Treatment:  surveillance. Oral iron and B12  supplementation. IV iron if indicated.  INTERVAL HISTORY:  Sabrina Randall is here today for repeat clinical assessment. She denies fevers or chills. She denies pain. Her appetite is good. Her weight {Weight change:10426}.  I have reviewed the past medical history, past surgical history, social history and family history with the patient and they are unchanged from previous note.  ALLERGIES:  has No Known Allergies.  MEDICATIONS:  Current Outpatient Medications  Medication Sig Dispense Refill   Cholecalciferol (VITAMIN D3 PO) Take by mouth.     Misc Natural Products (TART CHERRY ADVANCED PO) Take by mouth daily.     Multiple Vitamins-Minerals (WOMENS MULTIVITAMIN PO) Take by mouth.     Omega-3 Fatty Acids (FISH OIL PO) Take by mouth.     valACYclovir (VALTREX) 1000 MG tablet TAKE ONE TAB DAILY BEGINNING AT FIRST SIGNS OF OUTBREAK. TAKE FOR 5 DAYS. 15 tablet 0   No current facility-administered medications for this visit.    HISTORY OF PRESENT ILLNESS:   Oncology History   No history exists.      REVIEW OF SYSTEMS:   Constitutional: Denies fevers, chills or abnormal weight loss Eyes: Denies blurriness of vision Ears, nose, mouth, throat, and face: Denies mucositis or sore throat Respiratory: Denies cough, dyspnea or wheezes Cardiovascular: Denies palpitation, chest discomfort or lower extremity swelling Gastrointestinal:  Denies nausea, heartburn or change in bowel habits Skin: Denies abnormal skin rashes Lymphatics: Denies new lymphadenopathy or easy bruising Neurological:Denies numbness, tingling or new weaknesses Behavioral/Psych: Mood is stable, no new changes  All other systems were reviewed with the patient and are negative.  VITALS:  There were no vitals taken for this visit.  Wt Readings from Last 3 Encounters:  12/04/22 122 lb 1.6 oz (55.4 kg)  10/13/22 125 lb (56.7 kg)  01/13/22 130 lb 4 oz (59.1 kg)    There is no height or weight on file to calculate  BMI.  Performance status (ECOG): {CHL ONC [id number]  PHYSICAL EXAM:   GENERAL:alert, no distress and comfortable SKIN: skin color, texture, turgor are normal, no rashes or significant lesions EYES: normal, Conjunctiva are pink and non-injected, sclera clear OROPHARYNX:no exudate, no erythema and lips, buccal mucosa, and tongue normal  NECK: supple, thyroid normal size, non-tender, without nodularity LYMPH:  no palpable lymphadenopathy in the cervical, axillary or inguinal LUNGS: clear to auscultation and percussion with normal breathing effort HEART: regular rate & rhythm and no murmurs and no lower extremity edema ABDOMEN:abdomen soft, non-tender and normal bowel sounds Musculoskeletal:no cyanosis of digits and no clubbing  NEURO: alert & oriented x 3 with fluent speech, no focal motor/sensory deficits  LABORATORY DATA:  I have reviewed the data as listed    Component Value Date/Time   NA 139 10/13/2022 1140   K 4.3 10/13/2022 1140   CL 105 10/13/2022 1140   CO2 27 10/13/2022 1140   GLUCOSE 65 (L) 10/13/2022 1140   BUN 14 10/13/2022 1140   CREATININE 0.60 10/13/2022 1140   CALCIUM 9.7 10/13/2022 1140   PROT 7.4 01/13/2022 1440   ALBUMIN 4.4 01/13/2022 1440   AST 21 01/13/2022 1440   ALT 17 01/13/2022 1440   ALKPHOS 106 01/13/2022 1440   BILITOT 0.5 01/13/2022 1440    No results found for: "SPEP", "UPEP"  Lab Results  Component Value Date   WBC 6.1 03/03/2023   NEUTROABS 4.1 03/03/2023   HGB 14.0 03/03/2023   HCT 41.0 03/03/2023   MCV 83.5 03/03/2023   PLT 258 03/03/2023      Chemistry      Component Value Date/Time   NA 139 10/13/2022 1140   K 4.3 10/13/2022 1140   CL 105 10/13/2022 1140   CO2 27 10/13/2022 1140   BUN 14 10/13/2022 1140   CREATININE 0.60 10/13/2022 1140      Component Value Date/Time   CALCIUM 9.7 10/13/2022 1140   ALKPHOS 106 01/13/2022 1440   AST 21 01/13/2022 1440   ALT 17 01/13/2022 1440   BILITOT 0.5 01/13/2022 1440        RADIOGRAPHIC STUDIES: I have personally reviewed the radiological images as listed and agreed with the findings in the report. No results found.

## 2023-03-06 ENCOUNTER — Inpatient Hospital Stay: Payer: 59 | Admitting: Nurse Practitioner

## 2023-03-06 DIAGNOSIS — D509 Iron deficiency anemia, unspecified: Secondary | ICD-10-CM

## 2023-03-09 ENCOUNTER — Other Ambulatory Visit: Payer: Self-pay

## 2023-03-09 ENCOUNTER — Telehealth: Payer: Self-pay

## 2023-03-09 NOTE — Telephone Encounter (Signed)
Pt LVM requesting a call back regarding her results.  Pt did not indicate what results.  Returned pt's call but got pt's voicemail.  LVM stating that Dr. Mosetta Putt & Diego Cory, NP office received the pt's voicemail.  Instructed pt to give their office a return call regarding the results the pt would like to discuss.  Awaiting pt's return call.

## 2023-03-09 NOTE — Telephone Encounter (Signed)
Hey shelbra. That has been my plan. She had two phone visits last week and did not answer the phone for either visit. The first one, she called back and we rescheduled for the next day. The next day, she didn't answer the call, called back while I was with another patient. Called again, and she didn't answer. That is what I was going to discuss with her during the phone visit. If she is agreeable, I will put in orders for IV iron at [facility].  Thanks. Herbert Seta

## 2023-03-10 ENCOUNTER — Telehealth: Payer: Self-pay

## 2023-03-10 ENCOUNTER — Other Ambulatory Visit: Payer: Self-pay

## 2023-03-10 NOTE — Telephone Encounter (Signed)
Pt called stating she wanted to discuss her recent lab results.  Informed pt that her Ferritin was 7 and Heather would like for the pt to do IV Iron infusions at our [facility].  Pt stated she's in agreement with doing the IV Iron infusions.  Pt's only concern is the amount of the infusions that her insurance will cover.  Recommended the pt to contact her insurance to see what's the deductible she will need to meet before her insurance will cover the infusions 100%.  Stated that at time of scheduling, she can ask the [facility] scheduler or her insurance what the out-of-pocket cost will be.  Pt verbalized understanding and had no further questions or concerns.

## 2023-03-11 ENCOUNTER — Encounter: Payer: Self-pay | Admitting: Nurse Practitioner

## 2023-03-11 ENCOUNTER — Other Ambulatory Visit: Payer: Self-pay

## 2023-03-11 ENCOUNTER — Other Ambulatory Visit: Payer: Self-pay | Admitting: Nurse Practitioner

## 2023-03-11 ENCOUNTER — Telehealth: Payer: Self-pay | Admitting: Pharmacy Technician

## 2023-03-11 DIAGNOSIS — R790 Abnormal level of blood mineral: Secondary | ICD-10-CM

## 2023-03-11 NOTE — Telephone Encounter (Addendum)
Vista Lawman note:  patient will be scheduled as soon as possible.  Auth Submission: NO AUTH NEEDED Site of care: Site of care: [facility] Payer: [employer] Medication & CPT/J Code(s) submitted: Venofer (Iron Sucrose) J1756 Route of submission (phone, fax, portal):  Phone # Fax # Auth type: Buy/Bill PB Units/visits requested: 3 DOSES Reference number:  Approval from: 03/11/23 to 04/21/23

## 2023-03-11 NOTE — Progress Notes (Signed)
Orders placed for patient to receive IV iron weekly for 3 visits. Recheck labs in 6 weeks to determine further follow up.

## 2023-03-11 NOTE — Telephone Encounter (Signed)
Sabrina Randall. Orders placed for patient to receive IV iron weekly for 3 visits. Recheck labs in 6 weeks to determine further follow up. Will you make sure I did it correctly. I can't find where I wrote down the instructions. Please have her schedule for labs about 4 weeks after she has last scheduled iron treatment.  Thanks so much.  Herbert Seta

## 2023-03-12 ENCOUNTER — Telehealth: Payer: Self-pay | Admitting: Nurse Practitioner

## 2023-03-12 NOTE — Telephone Encounter (Signed)
Called and unable to leave message to schedule appointments.

## 2023-04-14 ENCOUNTER — Encounter: Payer: Self-pay | Admitting: Nurse Practitioner

## 2023-04-14 NOTE — Progress Notes (Signed)
Appointment cancelled

## 2023-05-03 ENCOUNTER — Other Ambulatory Visit: Payer: Self-pay | Admitting: Nurse Practitioner

## 2023-05-03 DIAGNOSIS — D509 Iron deficiency anemia, unspecified: Secondary | ICD-10-CM

## 2023-05-03 DIAGNOSIS — R790 Abnormal level of blood mineral: Secondary | ICD-10-CM

## 2023-05-03 NOTE — Assessment & Plan Note (Deleted)
 12/04/2022 - reviewed most recent labs from PCP done 10/13/2022 Hgb 10.2; Hct 34.3, Plt 450; Ferritin 4.0; vitamin B12 297 Was recommended she take both iron and B12 supplements per PCP 03/03/2023.  Labs showed low ferritin with normal hemoglobin hematocrit.

## 2023-05-03 NOTE — Progress Notes (Deleted)
 Patient Care Team: Corwin Antu, FNP as PCP - General (Family Medicine) Jinny Carmine, MD as Consulting Physician (Gastroenterology) Hanford Powell BRAVO, NP as Nurse Practitioner (Hematology and Oncology)  Clinic Day:  05/03/2023  Referring physician: Corwin Antu, FNP  ASSESSMENT & PLAN:   Assessment & Plan: Microcytic hypochromic anemia 12/04/2022 - reviewed most recent labs from PCP done 10/13/2022 Hgb 10.2; Hct 34.3, Plt 450; Ferritin 4.0; vitamin B12 297 Was recommended she take both iron and B12 supplements per PCP 03/03/2023.  Labs showed low ferritin with normal hemoglobin hematocrit.    The patient understands the plans discussed today and is in agreement with them.  She knows to contact our office if she develops concerns prior to her next appointment.  I provided *** minutes of face-to-face time during this encounter and > 50% was spent counseling as documented under my assessment and plan.    Powell BRAVO Hanford, NP   [facility] - A DEPT OF [facility] [address] [place] [address] Dept: [phone] Dept Fax: [phone]   No orders of the defined types were placed in this encounter.     CHIEF COMPLAINT:  CC: Microcytic anemia  Current Treatment: Oral iron and B12 supplements daily.  INTERVAL HISTORY:  Sabrina Randall is here today for repeat clinical assessment.  She was last seen by myself on 12/04/2022.  Advised to take oral iron and B12 supplements after recent labs.  Labs were done 03/03/2023.  They showed low ferritin with normal hemoglobin hematocrit.  She denies fevers or chills. She denies pain. Her appetite is good. Her weight {Weight change:10426}.  I have reviewed the past medical history, past surgical history, social history and family history with the patient and they are unchanged from previous note.  ALLERGIES:  has no known allergies.  MEDICATIONS:  Current Outpatient  Medications  Medication Sig Dispense Refill   Cholecalciferol (VITAMIN D3 PO) Take by mouth.     Misc Natural Products (TART CHERRY ADVANCED PO) Take by mouth daily.     Multiple Vitamins-Minerals (WOMENS MULTIVITAMIN PO) Take by mouth.     Omega-3 Fatty Acids (FISH OIL PO) Take by mouth.     valACYclovir  (VALTREX ) 1000 MG tablet TAKE ONE TAB DAILY BEGINNING AT FIRST SIGNS OF OUTBREAK. TAKE FOR 5 DAYS. 15 tablet 0   No current facility-administered medications for this visit.     REVIEW OF SYSTEMS:   Constitutional: Denies fevers, chills or abnormal weight loss Eyes: Denies blurriness of vision Ears, nose, mouth, throat, and face: Denies mucositis or sore throat Respiratory: Denies cough, dyspnea or wheezes Cardiovascular: Denies palpitation, chest discomfort or lower extremity swelling Gastrointestinal:  Denies nausea, heartburn or change in bowel habits Skin: Denies abnormal skin rashes Lymphatics: Denies new lymphadenopathy or easy bruising Neurological:Denies numbness, tingling or new weaknesses Behavioral/Psych: Mood is stable, no new changes  All other systems were reviewed with the patient and are negative.   VITALS:  There were no vitals taken for this visit.  Wt Readings from Last 3 Encounters:  12/04/22 122 lb 1.6 oz (55.4 kg)  10/13/22 125 lb (56.7 kg)  01/13/22 130 lb 4 oz (59.1 kg)    There is no height or weight on file to calculate BMI.  Performance status (ECOG): {CHL ONC [id number]  PHYSICAL EXAM:   GENERAL:alert, no distress and comfortable SKIN: skin color, texture, turgor are normal, no rashes or significant lesions EYES: normal, Conjunctiva are pink and non-injected,  sclera clear OROPHARYNX:no exudate, no erythema and lips, buccal mucosa, and tongue normal  NECK: supple, thyroid  normal size, non-tender, without nodularity LYMPH:  no palpable lymphadenopathy in the cervical, axillary or inguinal LUNGS: clear to auscultation and percussion with  normal breathing effort HEART: regular rate & rhythm and no murmurs and no lower extremity edema ABDOMEN:abdomen soft, non-tender and normal bowel sounds Musculoskeletal:no cyanosis of digits and no clubbing  NEURO: alert & oriented x 3 with fluent speech, no focal motor/sensory deficits  LABORATORY DATA:  I have reviewed the data as listed    Component Value Date/Time   NA 139 10/13/2022 1140   K 4.3 10/13/2022 1140   CL 105 10/13/2022 1140   CO2 27 10/13/2022 1140   GLUCOSE 65 (L) 10/13/2022 1140   BUN 14 10/13/2022 1140   CREATININE 0.60 10/13/2022 1140   CALCIUM 9.7 10/13/2022 1140   PROT 7.4 01/13/2022 1440   ALBUMIN 4.4 01/13/2022 1440   AST 21 01/13/2022 1440   ALT 17 01/13/2022 1440   ALKPHOS 106 01/13/2022 1440   BILITOT 0.5 01/13/2022 1440    No results found for: SPEP, UPEP  Lab Results  Component Value Date   WBC 6.1 03/03/2023   NEUTROABS 4.1 03/03/2023   HGB 14.0 03/03/2023   HCT 41.0 03/03/2023   MCV 83.5 03/03/2023   PLT 258 03/03/2023      Chemistry      Component Value Date/Time   NA 139 10/13/2022 1140   K 4.3 10/13/2022 1140   CL 105 10/13/2022 1140   CO2 27 10/13/2022 1140   BUN 14 10/13/2022 1140   CREATININE 0.60 10/13/2022 1140      Component Value Date/Time   CALCIUM 9.7 10/13/2022 1140   ALKPHOS 106 01/13/2022 1440   AST 21 01/13/2022 1440   ALT 17 01/13/2022 1440   BILITOT 0.5 01/13/2022 1440       RADIOGRAPHIC STUDIES: I have personally reviewed the radiological images as listed and agreed with the findings in the report. No results found.

## 2023-05-04 ENCOUNTER — Inpatient Hospital Stay: Payer: 59 | Attending: Nurse Practitioner

## 2023-05-04 ENCOUNTER — Telehealth: Payer: Self-pay

## 2023-05-04 ENCOUNTER — Other Ambulatory Visit: Payer: Self-pay

## 2023-05-04 ENCOUNTER — Inpatient Hospital Stay: Payer: 59 | Admitting: Nurse Practitioner

## 2023-05-04 NOTE — Telephone Encounter (Signed)
 Called patient due to not showing up for lab appointment. Was unable to speak to anyone so I left a message on the patients VM. Will no show patient to appointments.

## 2023-08-17 ENCOUNTER — Encounter: Payer: Self-pay | Admitting: Family

## 2023-08-17 ENCOUNTER — Ambulatory Visit (INDEPENDENT_AMBULATORY_CARE_PROVIDER_SITE_OTHER): Payer: 59 | Admitting: Family

## 2023-08-17 DIAGNOSIS — E559 Vitamin D deficiency, unspecified: Secondary | ICD-10-CM

## 2023-08-17 DIAGNOSIS — R5383 Other fatigue: Secondary | ICD-10-CM

## 2023-08-17 DIAGNOSIS — Z0001 Encounter for general adult medical examination with abnormal findings: Secondary | ICD-10-CM | POA: Insufficient documentation

## 2023-08-17 DIAGNOSIS — G4719 Other hypersomnia: Secondary | ICD-10-CM

## 2023-08-17 DIAGNOSIS — R790 Abnormal level of blood mineral: Secondary | ICD-10-CM | POA: Diagnosis not present

## 2023-08-17 DIAGNOSIS — E782 Mixed hyperlipidemia: Secondary | ICD-10-CM | POA: Diagnosis not present

## 2023-08-17 DIAGNOSIS — Z1231 Encounter for screening mammogram for malignant neoplasm of breast: Secondary | ICD-10-CM

## 2023-08-17 LAB — CBC WITH DIFFERENTIAL/PLATELET
Basophils Absolute: 0 10*3/uL (ref 0.0–0.1)
Basophils Relative: 0.6 % (ref 0.0–3.0)
Eosinophils Absolute: 0.1 10*3/uL (ref 0.0–0.7)
Eosinophils Relative: 1.2 % (ref 0.0–5.0)
HCT: 36.7 % (ref 36.0–46.0)
Hemoglobin: 11.4 g/dL — ABNORMAL LOW (ref 12.0–15.0)
Lymphocytes Relative: 34.9 % (ref 12.0–46.0)
Lymphs Abs: 1.7 10*3/uL (ref 0.7–4.0)
MCHC: 31.2 g/dL (ref 30.0–36.0)
MCV: 76.5 fl — ABNORMAL LOW (ref 78.0–100.0)
Monocytes Absolute: 0.4 10*3/uL (ref 0.1–1.0)
Monocytes Relative: 8.1 % (ref 3.0–12.0)
Neutro Abs: 2.7 10*3/uL (ref 1.4–7.7)
Neutrophils Relative %: 55.2 % (ref 43.0–77.0)
Platelets: 396 10*3/uL (ref 150.0–400.0)
RBC: 4.79 Mil/uL (ref 3.87–5.11)
RDW: 17.2 % — ABNORMAL HIGH (ref 11.5–15.5)
WBC: 4.8 10*3/uL (ref 4.0–10.5)

## 2023-08-17 LAB — COMPREHENSIVE METABOLIC PANEL WITH GFR
ALT: 20 U/L (ref 0–35)
AST: 21 U/L (ref 0–37)
Albumin: 4.4 g/dL (ref 3.5–5.2)
Alkaline Phosphatase: 88 U/L (ref 39–117)
BUN: 11 mg/dL (ref 6–23)
CO2: 27 meq/L (ref 19–32)
Calcium: 9.4 mg/dL (ref 8.4–10.5)
Chloride: 104 meq/L (ref 96–112)
Creatinine, Ser: 0.53 mg/dL (ref 0.40–1.20)
GFR: 109.13 mL/min (ref >60.00)
Glucose, Bld: 86 mg/dL (ref 70–99)
Potassium: 4.6 meq/L (ref 3.5–5.1)
Sodium: 139 meq/L (ref 135–145)
Total Bilirubin: 0.5 mg/dL (ref 0.2–1.2)
Total Protein: 7.2 g/dL (ref 6.0–8.3)

## 2023-08-17 LAB — LIPID PANEL
Cholesterol: 209 mg/dL — ABNORMAL HIGH (ref 0–200)
HDL: 65.5 mg/dL (ref >39.00)
LDL Cholesterol: 125 mg/dL — ABNORMAL HIGH (ref 0–99)
NonHDL: 143.91
Total CHOL/HDL Ratio: 3
Triglycerides: 93 mg/dL (ref 0.0–149.0)
VLDL: 18.6 mg/dL (ref 0.0–40.0)

## 2023-08-17 LAB — T3, FREE: T3, Free: 3.2 pg/mL (ref 2.3–4.2)

## 2023-08-17 LAB — IBC PANEL
Iron: 28 ug/dL — ABNORMAL LOW (ref 42–145)
Saturation Ratios: 4.6 % — ABNORMAL LOW (ref 20.0–50.0)
TIBC: 603.4 ug/dL — ABNORMAL HIGH (ref 250.0–450.0)
Transferrin: 431 mg/dL — ABNORMAL HIGH (ref 212.0–360.0)

## 2023-08-17 LAB — TSH: TSH: 0.54 u[IU]/mL (ref 0.35–5.50)

## 2023-08-17 LAB — T4, FREE: Free T4: 0.79 ng/dL (ref 0.60–1.60)

## 2023-08-17 LAB — VITAMIN B12: Vitamin B-12: 481 pg/mL (ref 211–911)

## 2023-08-17 LAB — FERRITIN: Ferritin: 4.2 ng/mL — ABNORMAL LOW (ref 10.0–291.0)

## 2023-08-17 LAB — VITAMIN D 25 HYDROXY (VIT D DEFICIENCY, FRACTURES): VITD: 38.1 ng/mL (ref 30.00–100.00)

## 2023-08-17 NOTE — Assessment & Plan Note (Signed)

## 2023-08-17 NOTE — Addendum Note (Signed)
 Addended by: Gerry Krone on: 08/17/2023 11:10 AM   Modules accepted: Orders

## 2023-08-17 NOTE — Assessment & Plan Note (Signed)
 Ordered vitamin d pending results.

## 2023-08-17 NOTE — Patient Instructions (Addendum)
 I have sent an electronic order over to your preferred location for the following:   []   2D Mammogram  [x]   3D Mammogram  []   Bone Density   Please give this center a call to get scheduled at your convenience.   [x]   [facility]      [address] [place], [place]        [phone]         Make sure to wear two piece  clothing  No lotions powders or deodorants the day of the appointment Make sure to bring picture ID and insurance card.  Bring list of medications you are currently taking including any supplements.    Do not schedule until after 11/18/2023   ------------------------------------  Start some over the counter flonase for allergies

## 2023-08-17 NOTE — Assessment & Plan Note (Signed)
 Fatigue ongoing Likely IDA However will order thyroid  panel as well and cmp to r/o any deficiencies or abn

## 2023-08-17 NOTE — Assessment & Plan Note (Signed)
 Repeat cbc ibc ferritin  Pt to f/u with hematology I imagine iron levels are very low as she did not f/u for Infusions and is symptomatic with dizziness and fatigue.

## 2023-08-17 NOTE — Progress Notes (Signed)
 Subjective:  Patient ID: Sabrina Randall, female    DOB: 1975/05/17  Age: 48 y.o. MRN: [id number]  Patient Care Team: Felicita Horns, FNP as PCP - General (Family Medicine) Marnee Sink, MD as Consulting Physician (Gastroenterology) Sharyon Deis, NP as Nurse Practitioner (Hematology and Oncology)   CC:  Chief Complaint  Patient presents with   Annual Exam    HPI Sabrina Randall is a 48 y.o. female who presents today for an annual physical exam. She reports consuming a general diet. The patient has a physically strenuous job, but has no regular exercise apart from work.  She generally feels fairly well. She reports sleeping fairly well. She does not have additional problems to discuss today.   Vision:Within last year Dental:Receives regular dental care  Mammogram: 11/18/2022 Last pap: 11/26/2021 negative Colonoscopy: 05/27/21 every ten years   Pt is with acute concerns.   At times she will bend over and feel dizzy. She said this happened after giving blood earlier in the day. She says this occurs 1-2 times a week. When she comes up to standing position from bending she will feel dizzy which has been going on for the last months. She does state she has some allergies, at times pnd and feels her voice is more hoarse than normal.  IDA she was referred to hematology and set up for iron infusions however she did not schedule as a lot was going on at the time. No sob or heart palpitations, only dizziness.   Advanced Directives Patient does not have advanced directives   DEPRESSION SCREENING    08/17/2023   10:33 AM 10/13/2022   11:57 AM 01/13/2022    2:41 PM 01/08/2021    8:40 AM 07/13/2020    8:30 AM 04/10/2020    8:41 AM 03/28/2019    8:43 AM  PHQ 2/9 Scores  PHQ - 2 Score 0 0 0 0 0 0 0  PHQ- 9 Score 1 1     2      ROS: Negative unless specifically indicated above in HPI.    Current Outpatient Medications:    Cholecalciferol (VITAMIN D3 PO), Take by mouth., Disp: , Rfl:     Misc Natural Products (TART CHERRY ADVANCED PO), Take by mouth daily., Disp: , Rfl:    Multiple Vitamins-Minerals (WOMENS MULTIVITAMIN PO), Take by mouth., Disp: , Rfl:    Omega-3 Fatty Acids (FISH OIL PO), Take by mouth., Disp: , Rfl:    valACYclovir  (VALTREX ) 1000 MG tablet, TAKE ONE TAB DAILY BEGINNING AT FIRST SIGNS OF OUTBREAK. TAKE FOR 5 DAYS., Disp: 15 tablet, Rfl: 0    Objective:    BP 104/62 (BP Location: Right Arm, Patient Position: Sitting, Cuff Size: Normal)   Pulse 76   Temp 98.2 F (36.8 C) (Temporal)   Ht 5' 0.5" (1.537 m)   Wt 115 lb (52.2 kg)   SpO2 98%   BMI 22.09 kg/m   BP Readings from Last 3 Encounters:  08/17/23 104/62  12/04/22 110/73  10/13/22 110/62      Physical Exam Vitals reviewed.  Constitutional:      General: She is not in acute distress.    Appearance: Normal appearance. She is normal weight. She is not ill-appearing.  HENT:     Head: Normocephalic.     Right Ear: Tympanic membrane normal.     Left Ear: Tympanic membrane normal.     Nose: Nose normal.     Right Turbinates: Swollen.     Right  Sinus: No maxillary sinus tenderness or frontal sinus tenderness.     Left Sinus: No maxillary sinus tenderness or frontal sinus tenderness.     Mouth/Throat:     Mouth: Mucous membranes are moist.  Eyes:     Extraocular Movements: Extraocular movements intact.     Pupils: Pupils are equal, round, and reactive to light.  Cardiovascular:     Rate and Rhythm: Normal rate and regular rhythm.  Pulmonary:     Effort: Pulmonary effort is normal.     Breath sounds: Normal breath sounds.  Abdominal:     General: Abdomen is flat. Bowel sounds are normal.     Palpations: Abdomen is soft.     Tenderness: There is no guarding or rebound.  Musculoskeletal:        General: Normal range of motion.     Cervical back: Normal range of motion.  Skin:    General: Skin is warm.     Capillary Refill: Capillary refill takes less than 2 seconds.  Neurological:      General: No focal deficit present.     Mental Status: She is alert.  Psychiatric:        Mood and Affect: Mood normal.        Behavior: Behavior normal.        Thought Content: Thought content normal.        Judgment: Judgment normal.          Assessment & Plan:  Encounter for general adult medical examination with abnormal findings Assessment & Plan: Patient Counseling(The following topics were reviewed):  Preventative care handout given to pt  Health maintenance and immunizations reviewed. Please refer to Health maintenance section. Pt advised on safe sex, wearing seatbelts in car, and proper nutrition labwork ordered today for annual Dental health: Discussed importance of regular tooth brushing, flossing, and dental visits.   Orders: -     Vitamin B12 -     Comprehensive metabolic panel with GFR -     CBC with Differential/Platelet -     TSH -     Lipid panel  Mixed hyperlipidemia Assessment & Plan: Ordered lipid panel, pending results. Work on low cholesterol diet and exercise as tolerated   Orders: -     Lipid panel  Other fatigue -     Iron and TIBC -     Ferritin -     CBC with Differential/Platelet -     TSH -     T4, free  Vitamin D  deficiency Assessment & Plan: Ordered vitamin d  pending results.    Orders: -     VITAMIN D  25 Hydroxy (Vit-D Deficiency, Fractures)  Excessive daytime sleepiness Assessment & Plan: Fatigue ongoing Likely IDA However will order thyroid  panel as well and cmp to r/o any deficiencies or abn  Orders: -     T3, free  Screening mammogram for breast cancer -     3D Screening Mammogram, Left and Right; Future  Low ferritin Assessment & Plan: Repeat cbc ibc ferritin  Pt to f/u with hematology I imagine iron levels are very low as she did not f/u for Infusions and is symptomatic with dizziness and fatigue.         Follow-up: Return in about 6 months (around 02/16/2024) for f/u cholesterol.   Felicita Horns, FNP

## 2023-08-17 NOTE — Assessment & Plan Note (Signed)
 Ordered lipid panel, pending results. Work on low cholesterol diet and exercise as tolerated

## 2023-09-10 LAB — AMB RESULTS CONSOLE CBG: Glucose: 95

## 2023-09-10 NOTE — Progress Notes (Signed)
 Patient came to mobile screening at [facility]. Patient wanted to check blood glucose and blood pressure. BP 106/65, non fasting glucose 95. Pt stated she is active and has no previous history of elevated levels. Patient indicated no SDOH needs at this time.

## 2023-11-20 ENCOUNTER — Ambulatory Visit
Admission: RE | Admit: 2023-11-20 | Discharge: 2023-11-20 | Disposition: A | Discharge status: Home / Self Care | Source: Ambulatory Visit | Attending: Family | Admitting: Family

## 2023-11-20 DIAGNOSIS — Z1231 Encounter for screening mammogram for malignant neoplasm of breast: Secondary | ICD-10-CM

## 2023-11-24 ENCOUNTER — Ambulatory Visit: Payer: Self-pay | Admitting: Family

## 2023-11-29 NOTE — Progress Notes (Signed)
 The patient attended a screening event on 09/10/2023 where her BP screening results was 106/65, non-fasting blood glucose 95. At the event pt stated she is active and has no previous history of elevated levels. At the event the patient did not document insurance coverage and does not smoke. Patient did not have any SDOH insecurities. Pt listed pcp as Dr. Ginger Patrick, FNP. At the screening event pt was advised to keep eating healthy by clinician Per chart review pt has a pcp and the last office visit was 08/17/2023 for annual exam. The pt BP was 104/62 on 08/17/2023. Chart review also indicates pt had a telehealth f/u results with pcp on 11/24/2023 . No additional Health equity team support indicated at this time.
